# Patient Record
Sex: Male | Born: 1960 | Hispanic: Yes | Smoking: Never smoker
Health system: Southern US, Community
[De-identification: ages and names within clinical notes are randomized; demographics above are authoritative.]

## PROBLEM LIST (undated history)

## (undated) DIAGNOSIS — I10 Essential (primary) hypertension: Secondary | ICD-10-CM

## (undated) DIAGNOSIS — F32A Depression, unspecified: Secondary | ICD-10-CM

## (undated) DIAGNOSIS — F329 Major depressive disorder, single episode, unspecified: Secondary | ICD-10-CM

## (undated) DIAGNOSIS — F419 Anxiety disorder, unspecified: Secondary | ICD-10-CM

## (undated) DIAGNOSIS — E669 Obesity, unspecified: Secondary | ICD-10-CM

## (undated) NOTE — *Deleted (*Deleted)
NAMEDaeshawn Richard, MRN:  [id number], DOB:  August 11, 1960, LOS: 3 ADMISSION DATE:  04/09/2019, CONSULTATION DATE:  04/16/2019 REFERRING MD:  TRH - Ghimire, CHIEF COMPLAINT:  Acute hypoxemic respiratory failure   Brief History   56 year old alcoholic male with COVID 19 pneumonia induced acute hypoxemic respiratory failure and alcohol withdrawal who became obtunded and hypoxemic and was intubated emergently.  PCCM consulted for sedation and vent management.  PCCM will assume care.  History of present illness   88 year old alcoholic male with COVID 19 pneumonia induced acute hypoxemic respiratory failure and alcohol withdrawal who became obtunded and hypoxemic and was intubated emergently.  PCCM consulted for sedation and vent management.  PCCM will assume care.  Past Medical History  Etoh abuse  Significant Hospital Events   11/17 intubation for respiratory failure  Consults:  PCCM  Procedures:  ETT 11/17>>>  Significant Diagnostic Tests:  CXR that I reviewed myself with diffuse pulmonary infiltrate  Micro Data:  COVID 19 positive Blood cx 11/15>>>NTD  Antimicrobials:  Remdisivir 11/15>>>  Rocephin 11/15>>> Zithromax 11/15>>>  Interim history/subjective:  Intubated yesterday for AMS and hypoxemic respiratory failure  Objective   Blood pressure 119/68, pulse 64, temperature 100.3 F (37.9 C), temperature source Axillary, resp. rate (!) 30, height 5\' 8"  (1.727 m), weight 95.6 kg, SpO2 94 %.    Vent Mode: PRVC FiO2 (%):  [50 %-100 %] 50 % Set Rate:  [25 bmp-30 bmp] 30 bmp Vt Set:  [420 mL] 420 mL PEEP:  [8 ZDG38-75 cmH20] 8 cmH20 Plateau Pressure:  [24 cmH20-33 cmH20] 24 cmH20   Intake/Output Summary (Last 24 hours) at 04/17/2019 0858 Last data filed at 04/17/2019 0700 Gross per 24 hour  Intake 1223.2 ml  Output 1150 ml  Net 73.2 ml   Filed Weights   04/15/19 0550  Weight: 95.6 kg    Examination: General: Acutely ill appearing male, NAD, sedate HENT: [place]/AT,  PERRL, EOM-spontaneous and MMM Lungs: Diminished diffusely Cardiovascular: RRR, Nl S1/S2 and -M/R/G Abdomen: Soft, NT, ND and +BS Extremities: -edema and -tenderness Neuro: Sedate, withdraws ext to pain Skin: Intact  I reviewed CXR myself, ETT ok and infiltrate noted  Discussed with RT and RN  Resolved Hospital Problem list   N/A  Assessment & Plan:  50 year old male with COVID-19 and alcohol withdrawal now in acute hypoxemic respiratory failure with diffuse infiltrate.  VDRF: - ARDS protocol - Titrate O2 for sat of 85-88% - Goal today 50/8, hold off weaning given mental status - Adjust vent for ABG - Hold off diureses today - Decadron stop date in place - Remdesivir stop date in place  CAP: - Rocephin - Zithromax - F/u on culture  ETOH withdrawal: - Precedex - Thiamine - Folate - MVI - CIWA protocol - Versed drip started overnight, will continue for now  FEN: - OGT - Continue TF  Labs   CBC: Recent Labs  Lab 04/05/2019 1640 04/15/19 0900 04/16/19 0750 04/16/19 1504 04/17/19 0542 04/17/19 0645  WBC 8.9 10.0 11.5*  --  10.5  --   NEUTROABS 8.0* 8.9* 9.9*  --  8.3*  --   HGB 11.8* 11.6* 11.1* 13.3 10.7* 11.6*  HCT 40.9 40.6 40.3 39.0 37.5* 34.0*  MCV 71.8* 73.3* 73.9*  --  73.0*  --   PLT 308 375 478*  --  430*  --     Basic Metabolic Panel: Recent Labs  Lab 04/20/2019 1640 04/15/19 0900 04/16/19 0750 04/16/19 1504 04/17/19 0542 04/17/19  0645  NA 139 141 145 143 143 144  K 3.6 4.0 3.9 4.0 4.1 3.8  CL 101 105 103  --  101  --   CO2 26 26 33*  --  30  --   GLUCOSE 117* 137* 121*  --  120*  --   BUN 12 16 24*  --  60*  --   CREATININE 0.91 0.70 0.81  --  1.85*  --   CALCIUM 8.2* 8.4* 8.6*  --  8.5*  --   MG  --  2.2  2.1  --   --  2.4  --   PHOS  --  2.9  2.8  --   --  3.2  --    GFR: Estimated Creatinine Clearance: 55.4 mL/min (A) (by C-G formula based on SCr of 1.85 mg/dL (H)). Recent Labs  Lab 04/17/2019 1640 04/15/19 0900 04/16/19  0750 04/17/19 0542  PROCALCITON 1.50  --   --   --   WBC 8.9 10.0 11.5* 10.5  LATICACIDVEN 1.5  --   --   --     Liver Function Tests: Recent Labs  Lab 04/07/2019 1640 04/15/19 0900 04/16/19 0750 04/17/19 0542  AST 68* 45* 34 29  ALT 51* 40 34 26  ALKPHOS 100 94 94 82  BILITOT 0.5 0.4 0.8 0.4  PROT 7.5 7.4 7.4 6.9  ALBUMIN 3.2* 2.9* 3.1* 2.8*   No results for input(s): LIPASE, AMYLASE in the last 168 hours. No results for input(s): AMMONIA in the last 168 hours.  ABG    Component Value Date/Time   PHART 7.410 04/17/2019 0645   PCO2ART 54.8 (H) 04/17/2019 0645   PO2ART 74.0 (L) 04/17/2019 0645   HCO3 34.6 (H) 04/17/2019 0645   TCO2 36 (H) 04/17/2019 0645   O2SAT 94.0 04/17/2019 0645     Coagulation Profile: No results for input(s): INR, PROTIME in the last 168 hours.  Cardiac Enzymes: No results for input(s): CKTOTAL, CKMB, CKMBINDEX, TROPONINI in the last 168 hours.  HbA1C: No results found for: HGBA1C  CBG: Recent Labs  Lab 04/16/19 1937 04/17/19 0015 04/17/19 0515  GLUCAP 123* 151* 140*   The patient is critically ill with multiple organ systems failure and requires high complexity decision making for assessment and support, frequent evaluation and titration of therapies, application of advanced monitoring technologies and extensive interpretation of multiple databases.   Critical Care Time devoted to patient care services described in this note is  34  Minutes. This time reflects time of care of this signee Dr Koren Bound. This critical care time does not reflect procedure time, or teaching time or supervisory time of PA/NP/Med student/Med Resident etc but could involve care discussion time.  Alyson Reedy, M.D. [facility] Pulmonary/Critical Care Medicine.

## (undated) NOTE — *Deleted (*Deleted)
NAME:  Spencer Richard, MRN:  [id number], DOB:  01/25/1973, LOS: 8 ADMISSION DATE:  04/10/2019, CONSULTATION DATE:  11/17 REFERRING MD:  TRH, CHIEF COMPLAINT:  dyspnea    Brief History   45 y/o alcoholic male with COVID 19 pneumonia, intubated in setting of encephalopathy and respiratory failure on 11/17.     Past Medical History  HTN   Significant Hospital Events   11/15-Admit 11/17- Intubation for respiratory failure, severe bradycardia on precedex requiring atropine 11/21- Persistent fevers, Rapid afib, started amio 11/22- Converted to NSR   Consults:  PCCM   Procedures:  ETT 11/17 >  R IJ CVL 11/21>    Significant Diagnostic Tests:      Micro Data:  COVID 19 positive Blood cx 11/15>>>NTD   Antimicrobials:  Remdisivir 11/15>>> 11/19 Rocephin 11/15>>> 11/20 Zithromax 11/16>>> 11/20  Cefepime 11/21 >> Decadron 11/16 >>   Interim history/subjective:  Remains in sinus rhythm Heavily sedated   Objective   Blood pressure 111/70, pulse 77, temperature 99.9 F (37.7 C), resp. rate (!) 0, height 5\' 8"  (1.727 m), weight 82 kg, SpO2 99 %.     >  Vent Mode: PCV FiO2 (%):  [40 %] 40 % Set Rate:  [30 bmp] 30 bmp PEEP:  [5 cmH20] 5 cmH20 Plateau Pressure:  [16 cmH20-27 cmH20] 21 cmH20      Intake/Output Summary (Last 24 hours) at 2019-05-06 1038 Last data filed at 05/06/19 0830    Gross per 24 hour  Intake 4413.85 ml  Output 1815 ml  Net 2598.85 ml         Filed Weights    04/20/19 0500 04/21/19 0350 05/06/19 0500  Weight: 81.2 kg 82 kg 82 kg      Examination:   General:  In bed on vent HENT: NCAT ETT in place PULM: CTA B, vent supported breathing CV: RRR, no mgr GI: BS+, soft, nontender MSK: normal bulk and tone Neuro: sedated on vent   11/22 CXR persistent bilateral infiltrates, ETT in place cardiomgally   Resolved Hospital Problem list       Assessment & Plan:  ARDS: improving Pressure support ventilation as long as tolerated today  VAP prevention Wean off sedation   Rapid atrial fibrillation> resolved Stop metoprolol given mild hypotension Change amiodarone to oral   hypotention due to sedation Stop metoprolol Wean off neosynephrine for MAP > 65   Pneumonia: HCAP? Continue cefepime for 5 days   EtOH withdrawal: over sedated at this point Wean ativan scheduled dosing Wean off fentanyl infusion Consider change to precedex on 11/24   Elevated blood sugar SSI   FEN Tube feeding   Best practice:  Diet: tube feeding Pain/Anxiety/Delirium protocol (if indicated): as above VAP protocol (if indicated): yes DVT prophylaxis: lovenox GI prophylaxis: famotidine Glucose control: ssi Mobility: bed rest Code Status: full Family Communication: will update on 11/23 if we can contact them Disposition: remain in ICU   Labs   CBC: Last Labs               Recent Labs  Lab 04/16/19 0750   04/17/19 0542   04/18/19 0427 04/19/19 0337 04/20/19 0330 04/21/19 0502 05-06-19 0415 05/06/2019 0455  WBC 11.5*  --  10.5  --  11.5* 10.6* 19.3* 31.2*  --  21.2*  NEUTROABS 9.9*  --  8.3*  --  9.3* 8.6* 16.8*  --   --   --   HGB 11.1*   < > 10.7*   < >  10.9* 10.4* 12.3* 12.0* 11.2* 9.7*  HCT 40.3   < > 37.5*   < > 39.2 37.1* 44.5 42.8 33.0* 33.9*  MCV 73.9*  --  73.0*  --  74.1* 73.0* 73.7* 74.0*  --  72.9*  PLT 478*  --  430*  --  503* 531* 631* 716*  --  570*   < > = values in this interval not displayed.        Basic Metabolic Panel: Last Labs              Recent Labs  Lab 04/17/19 0542   04/18/19 0427 04/19/19 0337 04/20/19 0330 04/20/19 2120 04/21/19 0502 04/13/2019 0415 04/02/2019 0455  NA 143   < > 147* 149* 149* 148* 146* 148* 150*  K 4.1   < > 4.2 4.3 3.9 4.6 4.2 4.4 4.4  CL 101  --  104 108 107 107 108  --  114*  CO2 30  --  33* 31 29 30 28   --  26  GLUCOSE 120*  --  148* 153* 228* 167* 181*  --  142*  BUN 60*  --  45* 40* 50* 64* 66*  --  91*  CREATININE 1.85*  --  0.86 0.77 1.30* 1.51* 1.17  --   2.33*  CALCIUM 8.5*  --  8.6* 8.6* 8.6* 8.3* 8.3*  --  8.0*  MG 2.4  --  2.5* 2.3  --  3.0* 3.0*  --  2.9*  PHOS 3.2  --  3.5 3.2  --   --  4.2  --  4.3   < > = values in this interval not displayed.      GFR: Estimated Creatinine Clearance: 37.9 mL/min (A) (by C-G formula based on SCr of 2.33 mg/dL (H)). Last Labs         Recent Labs  Lab 04/19/19 0337 04/20/19 0330 04/21/19 0502 04/24/2019 0455  PROCALCITON  --  1.02 0.82  --   WBC 10.6* 19.3* 31.2* 21.2*        Liver Function Tests: Last Labs          Recent Labs  Lab 04/16/19 0750 04/17/19 0542 04/18/19 0427 04/19/19 0337 04/20/19 0330  AST 34 29 38 36 44*  ALT 34 26 27 28  34  ALKPHOS 94 82 91 90 107  BILITOT 0.8 0.4 0.4 0.7 0.8  PROT 7.4 6.9 7.2 6.7 7.5  ALBUMIN 3.1* 2.8* 2.7* 2.5* 2.6*      Last Labs   No results for input(s): LIPASE, AMYLASE in the last 168 hours.   Last Labs   No results for input(s): AMMONIA in the last 168 hours.     ABG Labs (Brief)          Component Value Date/Time    PHART 7.326 (L) 04/03/2019 0415    PCO2ART 52.1 (H) 04/13/2019 0415    PO2ART 82.0 (L) 04/01/2019 0415    HCO3 26.9 04/24/2019 0415    TCO2 28 04/01/2019 0415    O2SAT 94.0 04/17/2019 0415        Coagulation Profile: Last Labs   No results for input(s): INR, PROTIME in the last 168 hours.     Cardiac Enzymes: Last Labs   No results for input(s): CKTOTAL, CKMB, CKMBINDEX, TROPONINI in the last 168 hours.     HbA1C: Last Labs         Hgb A1c MFr Bld  Date/Time Value Ref Range Status  04/20/2019 03:30 AM 6.5 (H) 4.8 -  5.6 % Final      Comment:      (NOTE) Pre diabetes:          5.7%-6.4% Diabetes:              >6.4% Glycemic control for   <7.0% adults with diabetes          CBG: Last Labs          Recent Labs  Lab 04/21/19 1717 04/21/19 2024 05/18/19 0017 05/18/19 0338 2019-05-18 0858  GLUCAP 157* 136* 169* 149* 141*          Critical care time: 35 minutes        Roselie Awkward, MD [place] [facility] Pager: [phone] Cell: [phone] If no response, call [phone]                      Record: [id number] Message: Clinical Note

---
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
## 2019-03-31 DIAGNOSIS — E119 Type 2 diabetes mellitus without complications: Secondary | ICD-10-CM

## 2019-03-31 HISTORY — DX: Type 2 diabetes mellitus without complications: E11.9

## 2019-04-14 ENCOUNTER — Other Ambulatory Visit: Payer: Self-pay

## 2019-04-14 ENCOUNTER — Inpatient Hospital Stay (HOSPITAL_COMMUNITY)
Admission: EM | Admit: 2019-04-14 | Discharge: 2019-04-30 | DRG: 207 | Disposition: E | Payer: HRSA Program | Attending: Internal Medicine | Admitting: Internal Medicine

## 2019-04-14 ENCOUNTER — Emergency Department (HOSPITAL_COMMUNITY): Payer: HRSA Program

## 2019-04-14 ENCOUNTER — Ambulatory Visit (HOSPITAL_COMMUNITY)
Admission: EM | Admit: 2019-04-14 | Discharge: 2019-04-14 | Disposition: A | Discharge status: Nursing Home (Includes ICF) | Payer: Self-pay | Attending: Emergency Medicine | Admitting: Emergency Medicine

## 2019-04-14 DIAGNOSIS — R6521 Severe sepsis with septic shock: Secondary | ICD-10-CM | POA: Diagnosis not present

## 2019-04-14 DIAGNOSIS — D649 Anemia, unspecified: Secondary | ICD-10-CM | POA: Diagnosis not present

## 2019-04-14 DIAGNOSIS — Y92239 Unspecified place in hospital as the place of occurrence of the external cause: Secondary | ICD-10-CM | POA: Diagnosis not present

## 2019-04-14 DIAGNOSIS — N493 Fournier gangrene: Secondary | ICD-10-CM | POA: Diagnosis not present

## 2019-04-14 DIAGNOSIS — T4275XA Adverse effect of unspecified antiepileptic and sedative-hypnotic drugs, initial encounter: Secondary | ICD-10-CM | POA: Diagnosis not present

## 2019-04-14 DIAGNOSIS — Z978 Presence of other specified devices: Secondary | ICD-10-CM

## 2019-04-14 DIAGNOSIS — Z1611 Resistance to penicillins: Secondary | ICD-10-CM | POA: Diagnosis present

## 2019-04-14 DIAGNOSIS — J80 Acute respiratory distress syndrome: Secondary | ICD-10-CM | POA: Diagnosis present

## 2019-04-14 DIAGNOSIS — E870 Hyperosmolality and hypernatremia: Secondary | ICD-10-CM | POA: Diagnosis not present

## 2019-04-14 DIAGNOSIS — J1289 Other viral pneumonia: Secondary | ICD-10-CM | POA: Diagnosis present

## 2019-04-14 DIAGNOSIS — F329 Major depressive disorder, single episode, unspecified: Secondary | ICD-10-CM | POA: Diagnosis present

## 2019-04-14 DIAGNOSIS — J9601 Acute respiratory failure with hypoxia: Secondary | ICD-10-CM

## 2019-04-14 DIAGNOSIS — A4189 Other specified sepsis: Secondary | ICD-10-CM | POA: Diagnosis not present

## 2019-04-14 DIAGNOSIS — Z6839 Body mass index (BMI) 39.0-39.9, adult: Secondary | ICD-10-CM

## 2019-04-14 DIAGNOSIS — N170 Acute kidney failure with tubular necrosis: Secondary | ICD-10-CM | POA: Diagnosis not present

## 2019-04-14 DIAGNOSIS — E8770 Fluid overload, unspecified: Secondary | ICD-10-CM | POA: Diagnosis not present

## 2019-04-14 DIAGNOSIS — F10231 Alcohol dependence with withdrawal delirium: Secondary | ICD-10-CM | POA: Diagnosis not present

## 2019-04-14 DIAGNOSIS — E1165 Type 2 diabetes mellitus with hyperglycemia: Secondary | ICD-10-CM | POA: Diagnosis not present

## 2019-04-14 DIAGNOSIS — I480 Paroxysmal atrial fibrillation: Secondary | ICD-10-CM | POA: Diagnosis not present

## 2019-04-14 DIAGNOSIS — R001 Bradycardia, unspecified: Secondary | ICD-10-CM | POA: Diagnosis not present

## 2019-04-14 DIAGNOSIS — G312 Degeneration of nervous system due to alcohol: Secondary | ICD-10-CM | POA: Diagnosis not present

## 2019-04-14 DIAGNOSIS — U071 COVID-19: Principal | ICD-10-CM | POA: Diagnosis present

## 2019-04-14 DIAGNOSIS — Z79899 Other long term (current) drug therapy: Secondary | ICD-10-CM | POA: Diagnosis not present

## 2019-04-14 DIAGNOSIS — B961 Klebsiella pneumoniae [K. pneumoniae] as the cause of diseases classified elsewhere: Secondary | ICD-10-CM | POA: Diagnosis present

## 2019-04-14 DIAGNOSIS — Z515 Encounter for palliative care: Secondary | ICD-10-CM | POA: Diagnosis not present

## 2019-04-14 DIAGNOSIS — Z452 Encounter for adjustment and management of vascular access device: Secondary | ICD-10-CM

## 2019-04-14 DIAGNOSIS — Z66 Do not resuscitate: Secondary | ICD-10-CM | POA: Diagnosis not present

## 2019-04-14 DIAGNOSIS — J9600 Acute respiratory failure, unspecified whether with hypoxia or hypercapnia: Secondary | ICD-10-CM

## 2019-04-14 DIAGNOSIS — J189 Pneumonia, unspecified organism: Secondary | ICD-10-CM

## 2019-04-14 DIAGNOSIS — Z9289 Personal history of other medical treatment: Secondary | ICD-10-CM

## 2019-04-14 DIAGNOSIS — R0902 Hypoxemia: Secondary | ICD-10-CM

## 2019-04-14 DIAGNOSIS — I10 Essential (primary) hypertension: Secondary | ICD-10-CM | POA: Diagnosis present

## 2019-04-14 DIAGNOSIS — R0602 Shortness of breath: Secondary | ICD-10-CM

## 2019-04-14 DIAGNOSIS — Z20822 Contact with and (suspected) exposure to covid-19: Secondary | ICD-10-CM

## 2019-04-14 LAB — CBC WITH DIFFERENTIAL/PLATELET
Abs Immature Granulocytes: 0.04 10*3/uL (ref 0.00–0.07)
Basophils Absolute: 0 10*3/uL (ref 0.0–0.1)
Basophils Relative: 0 %
Eosinophils Absolute: 0 10*3/uL (ref 0.0–0.5)
Eosinophils Relative: 0 %
HCT: 40.9 % (ref 39.0–52.0)
Hemoglobin: 11.8 g/dL — ABNORMAL LOW (ref 13.0–17.0)
Immature Granulocytes: 0 %
Lymphocytes Relative: 8 %
Lymphs Abs: 0.7 10*3/uL (ref 0.7–4.0)
MCH: 20.7 pg — ABNORMAL LOW (ref 26.0–34.0)
MCHC: 28.9 g/dL — ABNORMAL LOW (ref 30.0–36.0)
MCV: 71.8 fL — ABNORMAL LOW (ref 80.0–100.0)
Monocytes Absolute: 0.2 10*3/uL (ref 0.1–1.0)
Monocytes Relative: 2 %
Neutro Abs: 8 10*3/uL — ABNORMAL HIGH (ref 1.7–7.7)
Neutrophils Relative %: 90 %
Platelets: 308 10*3/uL (ref 150–400)
RBC: 5.7 MIL/uL (ref 4.22–5.81)
RDW: 19.7 % — ABNORMAL HIGH (ref 11.5–15.5)
WBC: 8.9 10*3/uL (ref 4.0–10.5)
nRBC: 0 % (ref 0.0–0.2)

## 2019-04-14 LAB — COMPREHENSIVE METABOLIC PANEL
ALT: 51 U/L — ABNORMAL HIGH (ref 0–44)
AST: 68 U/L — ABNORMAL HIGH (ref 15–41)
Albumin: 3.2 g/dL — ABNORMAL LOW (ref 3.5–5.0)
Alkaline Phosphatase: 100 U/L (ref 38–126)
Anion gap: 12 (ref 5–15)
BUN: 12 mg/dL (ref 6–20)
CO2: 26 mmol/L (ref 22–32)
Calcium: 8.2 mg/dL — ABNORMAL LOW (ref 8.9–10.3)
Chloride: 101 mmol/L (ref 98–111)
Creatinine, Ser: 0.91 mg/dL (ref 0.61–1.24)
GFR calc Af Amer: >60 mL/min (ref >60)
GFR calc non Af Amer: >60 mL/min (ref >60)
Glucose, Bld: 117 mg/dL — ABNORMAL HIGH (ref 70–99)
Potassium: 3.6 mmol/L (ref 3.5–5.1)
Sodium: 139 mmol/L (ref 135–145)
Total Bilirubin: 0.5 mg/dL (ref 0.3–1.2)
Total Protein: 7.5 g/dL (ref 6.5–8.1)

## 2019-04-14 LAB — LACTIC ACID, PLASMA: Lactic Acid, Venous: 1.5 mmol/L (ref 0.5–1.9)

## 2019-04-14 LAB — PROCALCITONIN: Procalcitonin: 1.5 ng/mL

## 2019-04-14 LAB — LACTATE DEHYDROGENASE: LDH: 358 U/L — ABNORMAL HIGH (ref 98–192)

## 2019-04-14 LAB — FERRITIN: Ferritin: 62 ng/mL (ref 24–336)

## 2019-04-14 LAB — TRIGLYCERIDES: Triglycerides: 79 mg/dL (ref <150)

## 2019-04-14 LAB — D-DIMER, QUANTITATIVE: D-Dimer, Quant: 0.99 ug/mL-FEU — ABNORMAL HIGH (ref 0.00–0.50)

## 2019-04-14 LAB — FIBRINOGEN: Fibrinogen: 697 mg/dL — ABNORMAL HIGH (ref 210–475)

## 2019-04-14 LAB — SARS CORONAVIRUS 2 (TAT 6-24 HRS): SARS Coronavirus 2: POSITIVE — AB

## 2019-04-14 LAB — C-REACTIVE PROTEIN: CRP: 25.5 mg/dL — ABNORMAL HIGH (ref <1.0)

## 2019-04-14 MED ORDER — SODIUM CHLORIDE 0.9 % IV SOLN
200.0000 mg | Freq: Once | INTRAVENOUS | Status: AC
  Administered 2019-04-15: 200 mg via INTRAVENOUS
  Filled 2019-04-14: qty 40

## 2019-04-14 MED ORDER — SODIUM CHLORIDE 0.9 % IV SOLN: 100.0000 mg | Freq: Every day | INTRAVENOUS | Status: DC

## 2019-04-14 MED ORDER — ACETAMINOPHEN 500 MG PO TABS
1000.0000 mg | ORAL_TABLET | Freq: Once | ORAL | Status: AC
  Administered 2019-04-14: 17:00:00 1000 mg via ORAL
  Filled 2019-04-14: qty 2

## 2019-04-14 MED ORDER — DEXAMETHASONE SODIUM PHOSPHATE 10 MG/ML IJ SOLN
6.0000 mg | Freq: Once | INTRAMUSCULAR | Status: AC
  Administered 2019-04-14: 6 mg via INTRAVENOUS
  Filled 2019-04-14: qty 1

## 2019-04-14 NOTE — ED Triage Notes (Signed)
Ems called for patient

## 2019-04-14 NOTE — ED Triage Notes (Signed)
3:48pm- I called pt. Back & sat him in a wheelchair, he immediately started painting and wheezing for air, called for help.  3:50pm- Immediately took his vitals  BP-219/98 O2- 78 3:52pm- Grabbed a full oxygen tank  I left the room

## 2019-04-14 NOTE — ED Provider Notes (Addendum)
HPI  SUBJECTIVE:  Spencer Richard is a 58 y.o. male who presents with 3 to 4 days of dry cough, chest congestion, shortness of breath, dyspnea on exertion.  Unable to walk more than a few steps without becoming short of breath.  He reports anorexia.  No fevers at home, no chest pain, body aches.  Reports a "light" headache.  No loss of sense of smell or taste.  No nasal congestion, sore throat, vomiting.  Reports lower abdominal pain, some diarrhea.  No sick contacts or exposure to Covid.  He has been taking Tylenol and aspirin without improvement in his symptoms.  Symptoms are worse with lying down.  He also reports lower extremity edema, abdominal distention, nocturia.  Unsure if he has gained any weight.  No orthopnea, PND.  Past medical history of hypertension, GI bleed.  No history of asthma, emphysema, COPD, smoking.  He is not on any supplemental oxygen at home.  No history of diabetes, CHF, cirrhosis, PE, DVT.  History limited due to respiratory distress.   Past Medical History:  Diagnosis Date   Hypertension     No past surgical history on file.  No family history on file.  Social History   Tobacco Use   Smoking status: Never Smoker   Smokeless tobacco: Never Used  Substance Use Topics   Alcohol use: Yes    Comment: daily   Drug use: No    No current facility-administered medications for this encounter.   Current Outpatient Medications:    chlordiazePOXIDE (LIBRIUM) 10 MG capsule, twice a day today one tomorrow then d/c, Disp: 3 capsule, Rfl: 0   lurasidone (LATUDA) 80 MG TABS tablet, Take 80 mg by mouth daily with breakfast., Disp: , Rfl:    metoprolol succinate (TOPROL-XL) 100 MG 24 hr tablet, Take 1 tablet (100 mg total) by mouth daily. Take with or immediately following a meal., Disp: 90 tablet, Rfl: 1   mirtazapine (REMERON) 15 MG tablet, Take 1 tablet (15 mg total) by mouth at bedtime., Disp: 30 tablet, Rfl: 2   PARoxetine (PAXIL) 20 MG tablet, Take 1 tablet (20 mg  total) by mouth daily., Disp: 30 tablet, Rfl: 2  No Known Allergies   ROS  As noted in HPI.   Physical Exam  BP (!) 187/98   Temp (!) 103 F (39.4 C) (Other (Comment)) O2 sat 75-78%.  Heart rate 108 Constitutional: Well developed, well nourished, acute respiratory distress.  Coughing.  Only able to speak in short sentences Eyes: PERRL, EOMI, conjunctiva normal bilaterally HENT: Normocephalic, atraumatic,mucus membranes moist Respiratory: Poor inspiratory effort, clear to auscultation bilaterally, no rales, no wheezing, no rhonchi Cardiovascular: Tachycardic, no murmurs, no gallops, no rubs GI: Soft, nondistended, normal bowel sounds, nontender, no rebound, no guarding.  No anasarca skin: No rash, skin intact Musculoskeletal: 1Plus edema bilaterally lower extremities, no tenderness, no deformities Neurologic: Alert & oriented x 3, CN III-XII grossly intact, no motor deficits, sensation grossly intact Psychiatric: Speech and behavior appropriate   ED Course   Medications - No data to display  No orders of the defined types were placed in this encounter.  No results found for this or any previous visit (from the past 24 hour(s)). No results found.  ED Clinical Impression  1. Hypoxia   2. SOB (shortness of breath)      ED Assessment/Plan   Concern for Covid.  May have a component of CHF as well.  History was limited due to respiratory distress.  Patient is hypertensive,  febrile, hypoxic to 75-78% on room air improved to 94% on 4 L of supplemental oxygen. transferring to the ED via EMS for further care.   No orders of the defined types were placed in this encounter.   *This clinic note was created using Dragon dictation software. Therefore, there may be occasional mistakes despite careful proofreading.  ?    Melynda Ripple, MD 04/05/2019 1613    Melynda Ripple, MD 12/13/19 [phone]

## 2019-04-14 NOTE — ED Notes (Signed)
Respiratory at bedside switching pt to Hi-Flo n/c.

## 2019-04-14 NOTE — Progress Notes (Signed)
Pharmacy Antibiotic Note  Spencer Richard is a 58 y.o. male admitted on 04/29/2019 with COVID 19.  Pharmacy has been consulted for Remdesivir dosing.  ALT 51 CXR shows extensive bilateral interstitial and heterogeneous pulmonary opacity, consistent with multifocal infection. Pt on 30L HFNC  Plan: Remdesivir 200mg  IV now then 100mg  daily x 4 days Will f/u ALT     Temp (24hrs), Avg:102 F (38.9 C), Min:100.1 F (37.8 C), Max:103 F (39.4 C)  Recent Labs  Lab 04/03/2019 1640  WBC 8.9  CREATININE 0.91  LATICACIDVEN 1.5    CrCl cannot be calculated (Unknown ideal weight.).    No Known Allergies  Antimicrobials this admission: 11/15 Remdesivir >> 11/19  Microbiology results: 11/15 BCx:  11/15 SARS coronavirus 2: positive  Thank you for allowing pharmacy to be a part of this patient's care.  Sherlon Handing, PharmD, BCPS [facility] Clinical pharmacist phone [phone] 04/26/2019 11:08 PM

## 2019-04-14 NOTE — ED Triage Notes (Signed)
Pt had been feeling "bad" with generalized weakness, SOB & fever the past 4 days. This morning his SOB was worse so he went to the urgent care, they sent him here d/t de-saturations of 72% on RA & a fever. He was given 4L via n/c & his sats increased to 97% but with minimal exertion he de-sated again when he stood to get him on the EMS stretcher. EMS gave him 10L O2 via NRB while in route to ED & he went up to 98% again, pt is A/Ox4 upon arrival.

## 2019-04-14 NOTE — ED Provider Notes (Signed)
[facility] Emergency Department Provider Note MRN:  [id number]  Arrival date & time: 04/12/2019     Chief Complaint   fever/ low sats   History of Present Illness   Spencer Richard is a 58 y.o. year-old male with a history of hypertension presenting to the ED with chief complaint of fever.  5 days of fever, dry cough, general malaise.  Was seen at urgent care today, found to be hypoxic in the 70s.  Fever to 103.  Placed on nonrebreather by EMS with improvement in oxygenation.  Denies chest pain, no leg pain or swelling, no abdominal pain.  No known exposure to COVID-19.  Review of Systems  A complete 10 system review of systems was obtained and all systems are negative except as noted in the HPI and PMH.   Patient's Health History    Past Medical History:  Diagnosis Date  . Hypertension     History reviewed. No pertinent surgical history.  History reviewed. No pertinent family history.  Social History   Socioeconomic History  . Marital status: Married    Spouse name: Not on file  . Number of children: Not on file  . Years of education: Not on file  . Highest education level: Not on file  Occupational History  . Not on file  Social Needs  . Financial resource strain: Not on file  . Food insecurity    Worry: Not on file    Inability: Not on file  . Transportation needs    Medical: Not on file    Non-medical: Not on file  Tobacco Use  . Smoking status: Never Smoker  . Smokeless tobacco: Never Used  Substance and Sexual Activity  . Alcohol use: Yes    Comment: daily  . Drug use: No  . Sexual activity: Yes  Lifestyle  . Physical activity    Days per week: Not on file    Minutes per session: Not on file  . Stress: Not on file  Relationships  . Social Musicianconnections    Talks on phone: Not on file    Gets together: Not on file    Attends religious service: Not on file    Active member of club or organization: Not on file    Attends meetings of  clubs or organizations: Not on file    Relationship status: Not on file  . Intimate partner violence    Fear of current or ex partner: Not on file    Emotionally abused: Not on file    Physically abused: Not on file    Forced sexual activity: Not on file  Other Topics Concern  . Not on file  Social History Narrative  . Not on file     Physical Exam  Vital Signs and Nursing Notes reviewed Vitals:   04/15/2019 1654 04/24/2019 1655  BP:    Pulse: (!) 105 (!) 104  Resp: (!) 29 (!) 35  Temp:    SpO2: 97% 97%    CONSTITUTIONAL: Well-appearing, NAD NEURO:  Alert and oriented x 3, no focal deficits EYES:  eyes equal and reactive ENT/NECK:  no LAD, no JVD CARDIO: Regular rate, well-perfused, normal S1 and S2 PULM: Decreased breath sounds bilateral bases, mildly tachypneic GI/GU:  normal bowel sounds, non-distended, non-tender MSK/SPINE:  No gross deformities, no edema SKIN:  no rash, atraumatic PSYCH:  Appropriate speech and behavior  Diagnostic and Interventional Summary    EKG Interpretation  Date/Time:  Sunday April 14 2019 16:29:54 EST  Ventricular Rate:  103 PR Interval:    QRS Duration: 88 QT Interval:  337 QTC Calculation: 442 R Axis:   -27 Text Interpretation: Sinus tachycardia Borderline left axis deviation Confirmed by Gerlene Fee [phone]) on 04/18/2019 4:36:44 PM      Labs Reviewed  CBC WITH DIFFERENTIAL/PLATELET - Abnormal; Notable for the following components:      Result Value   Hemoglobin 11.8 (*)    MCV 71.8 (*)    MCH 20.7 (*)    MCHC 28.9 (*)    RDW 19.7 (*)    Neutro Abs 8.0 (*)    All other components within normal limits  COMPREHENSIVE METABOLIC PANEL - Abnormal; Notable for the following components:   Glucose, Bld 117 (*)    Calcium 8.2 (*)    Albumin 3.2 (*)    AST 68 (*)    ALT 51 (*)    All other components within normal limits  D-DIMER, QUANTITATIVE (NOT AT [facility]) - Abnormal; Notable for the following components:   D-Dimer, Quant 0.99  (*)    All other components within normal limits  LACTATE DEHYDROGENASE - Abnormal; Notable for the following components:   LDH 358 (*)    All other components within normal limits  FIBRINOGEN - Abnormal; Notable for the following components:   Fibrinogen 697 (*)    All other components within normal limits  C-REACTIVE PROTEIN - Abnormal; Notable for the following components:   CRP 25.5 (*)    All other components within normal limits  CULTURE, BLOOD (ROUTINE X 2)  CULTURE, BLOOD (ROUTINE X 2)  SARS CORONAVIRUS 2 (TAT 6-24 HRS)  LACTIC ACID, PLASMA  FERRITIN  TRIGLYCERIDES  LACTIC ACID, PLASMA  PROCALCITONIN    DG Chest Port 1 View  Final Result      Medications  dexamethasone (DECADRON) injection 6 mg (6 mg Intravenous Given 04/13/2019 1654)  acetaminophen (TYLENOL) tablet 1,000 mg (1,000 mg Oral Given 04/23/2019 1707)     Procedures  /  Critical Care .Critical Care Performed by: Maudie Flakes, MD Authorized by: Maudie Flakes, MD   Critical care provider statement:    Critical care time (minutes):  45   Critical care was necessary to treat or prevent imminent or life-threatening deterioration of the following conditions: Hypoxic respiratory failure, concern for COVID-19.   Critical care was time spent personally by me on the following activities:  Discussions with consultants, evaluation of patient's response to treatment, examination of patient, ordering and performing treatments and interventions, ordering and review of laboratory studies, ordering and review of radiographic studies, pulse oximetry, re-evaluation of patient's condition, obtaining history from patient or surrogate and review of old charts    ED Course and Medical Decision Making  I have reviewed the triage vital signs and the nursing notes.  Pertinent labs & imaging results that were available during my care of the patient were reviewed by me and considered in my medical decision making (see below for  details).     Concern for COVID-19, no chest pain, no evidence of DVT, low concern for PE especially given the prodrome of cold-like symptoms.  Patient seems relatively comfortable on nonrebreather, will transition to high flow nasal cannula with the help of respiratory therapy.  Anticipating admission.  Chest x-ray highly suspicious for multifocal viral pneumonia, admitted to hospitalist service for further care.  Patient seems relatively comfortable on high flow nasal cannula.  Barth Kirks. Sedonia Small, [place] [email or web address]  Final Clinical Impressions(s) / ED Diagnoses     ICD-10-CM   1. Multifocal pneumonia  J18.9   2. Acute respiratory failure with hypoxia (HCC)  J96.01   3. Suspected COVID-19 virus infection  Z20.828     ED Discharge Orders    None       Discharge Instructions Discussed with and Provided to Patient:   Discharge Instructions   None       Sabas Sous, MD 05-08-19 1815

## 2019-04-14 NOTE — ED Notes (Addendum)
Roselyn Reef, ED Charge RN notified of pt & potentially Covid pt.  EMS report given per Dr Alphonzo Cruise.

## 2019-04-14 NOTE — ED Triage Notes (Signed)
Pt c/o SOB.   SaO2 = 72% RA.  Placed on 4L O2 via [place] with SaO2 increased to 93%. [facility] called for pt transport.  Eval per Dr Alphonzo Cruise in progress.

## 2019-04-14 NOTE — ED Triage Notes (Signed)
Dr. Alphonzo Cruise at bedside with patient.

## 2019-04-14 NOTE — H&P (Signed)
History and Physical   Spencer Richard JJH:[id number] DOB: 01/25/1973 DOA: 04/03/2019  Referring MD/NP/PA: Dr. Sedonia Small  PCP: Pt, No Pcp Per (Inactive)   Outpatient Specialists: None  Patient coming from: Home  Chief Complaint: Shortness of breath and cough  HPI: Spencer Richard is a 58 y.o. male with medical history significant of hypertension, morbid obesity, depression with anxiety who presented with fever, shortness of breath and cough for the last 5 days.  Associated with diaphoresis.  Patient was brought to the ER on nonrebreather bag by EMS.  He was found to have a temperature 103 on arrival and was very tachypneic.  His evaluation showed bilateral infiltrates consistent with Covid pneumonia and subsequent testing shows COVID-19 positive.  Patient has improvement currently titrated to oxygen by nasal cannula.  He denied any knowledge of close contacts with Covid.  He denied any dizziness.  No nausea vomiting or diarrhea.  His oxygen sat originally was in the 70s.  That was earlier in the day when he went to urgent care center.  He looks comfortable now but due to Covid pneumonia with oxygen demand patient is being admitted to the hospital for treatment.  ED Course: Temperature 103 blood pressure 193/111 pulse 107 respiratory rate of 40 oxygen sats 92% on 2 L.  CBC and chemistry appear to be within normal.  D-dimer is 0.99 fibrinogen 697.  Chest x-ray showed bilateral infiltrates consistent with multifocal infection.  Patient has been initiated on antibiotics breathing treatment oxygen and is being admitted to the hospital.  Review of Systems: As per HPI otherwise 10 point review of systems negative.    Past Medical History:  Diagnosis Date  . Hypertension     History reviewed. No pertinent surgical history.   reports that he has never smoked. He has never used smokeless tobacco. He reports current alcohol use. He reports that he does not use drugs.  No Known Allergies  History  reviewed. No pertinent family history.   Prior to Admission medications   Medication Sig Start Date End Date Taking? Authorizing Provider  chlordiazePOXIDE (LIBRIUM) 10 MG capsule twice a day today one tomorrow then d/c Patient taking differently: Take 10 mg by mouth daily as needed for withdrawal.  11/16/18  Yes Johnn Hai, MD  lurasidone (LATUDA) 80 MG TABS tablet Take 80 mg by mouth daily with breakfast.   Yes [provider]  metoprolol succinate (TOPROL-XL) 100 MG 24 hr tablet Take 1 tablet (100 mg total) by mouth daily. Take with or immediately following a meal. 11/14/18  Yes Johnn Hai, MD  mirtazapine (REMERON) 15 MG tablet Take 1 tablet (15 mg total) by mouth at bedtime. 11/14/18 11/14/19 Yes Johnn Hai, MD  PARoxetine (PAXIL) 20 MG tablet Take 1 tablet (20 mg total) by mouth daily. 11/14/18  Yes Johnn Hai, MD    Physical Exam: Vitals:   04/19/2019 1845 04/17/2019 1900 04/10/2019 1913 04/15/2019 1941  BP: (!) 160/88 (!) 159/90  (!) 163/98  Pulse: 100 92  88  Resp: (!) 25 (!) 29  (!) 33  Temp:   100.1 F (37.8 C)   TempSrc:   Oral   SpO2: 97% 97%  96%      Constitutional: Morbidly obese, tachypneic, no acute distress Vitals:   04/09/2019 1845 04/27/2019 1900 04/27/2019 1913 04/13/2019 1941  BP: (!) 160/88 (!) 159/90  (!) 163/98  Pulse: 100 92  88  Resp: (!) 25 (!) 29  (!) 33  Temp:   100.1 F (37.8 C)  TempSrc:   Oral   SpO2: 97% 97%  96%   Eyes: PERRL, lids and conjunctivae normal ENMT: Mucous membranes are moist. Posterior pharynx clear of any exudate or lesions.Normal dentition.  Neck: normal, supple, no masses, no thyromegaly Respiratory: Decreased air entry bilaterally with coarse breath sounds, rhonchi, mild expiratory wheezing, use of accessory muscle.  Cardiovascular: Sinus tachycardia no murmurs / rubs / gallops. No extremity edema. 2+ pedal pulses. No carotid bruits.  Abdomen: no tenderness, no masses palpated. No hepatosplenomegaly. Bowel sounds positive.   Musculoskeletal: no clubbing / cyanosis. No joint deformity upper and lower extremities. Good ROM, no contractures. Normal muscle tone.  Skin: no rashes, lesions, ulcers. No induration Neurologic: CN 2-12 grossly intact. Sensation intact, DTR normal. Strength 5/5 in all 4.  Psychiatric: Normal judgment and insight. Alert and oriented x 3. Normal mood.     Labs on Admission: I have personally reviewed following labs and imaging studies  CBC: Recent Labs  Lab 28-Jun-2018 1640  WBC 8.9  NEUTROABS 8.0*  HGB 11.8*  HCT 40.9  MCV 71.8*  PLT 308   Basic Metabolic Panel: Recent Labs  Lab 28-Jun-2018 1640  NA 139  K 3.6  CL 101  CO2 26  GLUCOSE 117*  BUN 12  CREATININE 0.91  CALCIUM 8.2*   GFR: CrCl cannot be calculated (Unknown ideal weight.). Liver Function Tests: Recent Labs  Lab 28-Jun-2018 1640  AST 68*  ALT 51*  ALKPHOS 100  BILITOT 0.5  PROT 7.5  ALBUMIN 3.2*   No results for input(s): LIPASE, AMYLASE in the last 168 hours. No results for input(s): AMMONIA in the last 168 hours. Coagulation Profile: No results for input(s): INR, PROTIME in the last 168 hours. Cardiac Enzymes: No results for input(s): CKTOTAL, CKMB, CKMBINDEX, TROPONINI in the last 168 hours. BNP (last 3 results) No results for input(s): PROBNP in the last 8760 hours. HbA1C: No results for input(s): HGBA1C in the last 72 hours. CBG: No results for input(s): GLUCAP in the last 168 hours. Lipid Profile: Recent Labs    28-Jun-2018 1640  TRIG 79   Thyroid Function Tests: No results for input(s): TSH, T4TOTAL, FREET4, T3FREE, THYROIDAB in the last 72 hours. Anemia Panel: Recent Labs    28-Jun-2018 1640  FERRITIN 62   Urine analysis:    Component Value Date/Time   COLORURINE YELLOW 11/11/2018 2232   APPEARANCEUR HAZY (A) 11/11/2018 2232   LABSPEC 1.019 11/11/2018 2232   PHURINE 5.0 11/11/2018 2232   GLUCOSEU NEGATIVE 11/11/2018 2232   HGBUR MODERATE (A) 11/11/2018 2232   BILIRUBINUR NEGATIVE  11/11/2018 2232   KETONESUR NEGATIVE 11/11/2018 2232   PROTEINUR 100 (A) 11/11/2018 2232   NITRITE NEGATIVE 11/11/2018 2232   LEUKOCYTESUR NEGATIVE 11/11/2018 2232   Sepsis Labs: @LABRCNTIP (procalcitonin:4,lacticidven:4) ) Recent Results (from the past 240 hour(s))  SARS CORONAVIRUS 2 (TAT 6-24 HRS) Nasopharyngeal Nasopharyngeal Swab     Status: Abnormal   Collection Time: 28-Jun-2018  4:36 PM   Specimen: Nasopharyngeal Swab  Result Value Ref Range Status   SARS Coronavirus 2 POSITIVE (A) NEGATIVE Final    Comment: RESULT CALLED TO, READ BACK BY AND VERIFIED WITH: Pattricia BossKENNEDY R, RN AT 2248 ON 30-Jan-202020 BY SAINVILUS S (NOTE) SARS-CoV-2 target nucleic acids are DETECTED. The SARS-CoV-2 RNA is generally detectable in upper and lower respiratory specimens during the acute phase of infection. Positive results are indicative of active infection with SARS-CoV-2. Clinical  correlation with patient history and other diagnostic information is necessary to determine patient infection status.  Positive results do  not rule out bacterial infection or co-infection with other viruses. The expected result is Negative. Fact Sheet for Patients: HairSlick.no Fact Sheet for Healthcare Providers: quierodirigir.com This test is not yet approved or cleared by the [place] FDA and  has been authorized for detection and/or diagnosis of SARS-CoV-2 by FDA under an Emergency Use Authorization (EUA). This EUA will remain  in effect (meaning this test can  be used) for the duration of the COVID-19 declaration under Section 564(b)(1) of the Act, 21 U.S.C. section 360bbb-3(b)(1), unless the authorization is terminated or revoked sooner. Performed at [facility], [address], [place], [place] [address]      Radiological Exams on Admission: Dg Chest Port 1 View  Result Date: 04/15/2019 CLINICAL DATA:  Shortness of breath, fever, weakness EXAM:  PORTABLE CHEST 1 VIEW COMPARISON:  11/11/2018 FINDINGS: Cardiomegaly. Extensive bilateral interstitial and heterogeneous pulmonary opacity. The visualized skeletal structures are unremarkable. IMPRESSION: 1. Extensive bilateral interstitial and heterogeneous pulmonary opacity, consistent with multifocal infection. 2.  Cardiomegaly. Electronically Signed   By: Lauralyn Primes M.D.   On: 04/21/2019 17:54    EKG: Independently reviewed.  It shows sinus tachycardia with a rate of 103.  Evidence of LVH by voltage criteria.  Assessment/Plan Principal Problem:   Pneumonia due to COVID-19 virus Active Problems:   Major depressive disorder, single episode, severe without psychosis (HCC)   HTN (hypertension)   Tachypnea     #1 COVID-19 pneumonia: Patient will be admitted to [facility].  Initiate remdesivir with dexamethasone as well as Rocephin and Zithromax.  Also Lovenox and oxygen.  Albuterol and Atrovent inhalers.  Follow protocol by primary team.  #2 hypertension: Resume home regimen and monitor  #3 history of depression: Continue home regimen.  #4 acute hypoxemia: Secondary to #1 above.  Maintain on oxygen and titrate off  #5 morbid obesity: Dietary counseling   DVT prophylaxis: Lovenox Code Status: Full code Family Communication: No family at bedside Disposition Plan: Home Consults called: None Admission status: Inpatient  Severity of Illness: The appropriate patient status for this patient is INPATIENT. Inpatient status is judged to be reasonable and necessary in order to provide the required intensity of service to ensure the patient's safety. The patient's presenting symptoms, physical exam findings, and initial radiographic and laboratory data in the context of their chronic comorbidities is felt to place them at high risk for further clinical deterioration. Furthermore, it is not anticipated that the patient will be medically stable for discharge from the hospital within 2  midnights of admission. The following factors support the patient status of inpatient.   " The patient's presenting symptoms include shortness of breath and cough. " The worrisome physical exam findings include bilateral basal crackles. " The initial radiographic and laboratory data are worrisome because of bilateral pneumonia. " The chronic co-morbidities include hypertension.   * I certify that at the point of admission it is my clinical judgment that the patient will require inpatient hospital care spanning beyond 2 midnights from the point of admission due to high intensity of service, high risk for further deterioration and high frequency of surveillance required.Lonia Blood MD [facility] Pager [phone]  If 7PM-7AM, please contact night-coverage [email or web address] Password [email or web address]  04/06/2019, 11:07 PM

## 2019-04-15 LAB — CBC WITH DIFFERENTIAL/PLATELET
Abs Immature Granulocytes: 0.04 10*3/uL (ref 0.00–0.07)
Basophils Absolute: 0 10*3/uL (ref 0.0–0.1)
Basophils Relative: 0 %
Eosinophils Absolute: 0 10*3/uL (ref 0.0–0.5)
Eosinophils Relative: 0 %
HCT: 40.6 % (ref 39.0–52.0)
Hemoglobin: 11.6 g/dL — ABNORMAL LOW (ref 13.0–17.0)
Immature Granulocytes: 0 %
Lymphocytes Relative: 8 %
Lymphs Abs: 0.8 10*3/uL (ref 0.7–4.0)
MCH: 20.9 pg — ABNORMAL LOW (ref 26.0–34.0)
MCHC: 28.6 g/dL — ABNORMAL LOW (ref 30.0–36.0)
MCV: 73.3 fL — ABNORMAL LOW (ref 80.0–100.0)
Monocytes Absolute: 0.3 10*3/uL (ref 0.1–1.0)
Monocytes Relative: 3 %
Neutro Abs: 8.9 10*3/uL — ABNORMAL HIGH (ref 1.7–7.7)
Neutrophils Relative %: 89 %
Platelets: 375 10*3/uL (ref 150–400)
RBC: 5.54 MIL/uL (ref 4.22–5.81)
RDW: 20.1 % — ABNORMAL HIGH (ref 11.5–15.5)
WBC: 10 10*3/uL (ref 4.0–10.5)
nRBC: 0 % (ref 0.0–0.2)

## 2019-04-15 LAB — MAGNESIUM
Magnesium: 2.1 mg/dL (ref 1.7–2.4)
Magnesium: 2.2 mg/dL (ref 1.7–2.4)

## 2019-04-15 LAB — COMPREHENSIVE METABOLIC PANEL
ALT: 40 U/L (ref 0–44)
AST: 45 U/L — ABNORMAL HIGH (ref 15–41)
Albumin: 2.9 g/dL — ABNORMAL LOW (ref 3.5–5.0)
Alkaline Phosphatase: 94 U/L (ref 38–126)
Anion gap: 10 (ref 5–15)
BUN: 16 mg/dL (ref 6–20)
CO2: 26 mmol/L (ref 22–32)
Calcium: 8.4 mg/dL — ABNORMAL LOW (ref 8.9–10.3)
Chloride: 105 mmol/L (ref 98–111)
Creatinine, Ser: 0.7 mg/dL (ref 0.61–1.24)
GFR calc Af Amer: >60 mL/min (ref >60)
GFR calc non Af Amer: >60 mL/min (ref >60)
Glucose, Bld: 137 mg/dL — ABNORMAL HIGH (ref 70–99)
Potassium: 4 mmol/L (ref 3.5–5.1)
Sodium: 141 mmol/L (ref 135–145)
Total Bilirubin: 0.4 mg/dL (ref 0.3–1.2)
Total Protein: 7.4 g/dL (ref 6.5–8.1)

## 2019-04-15 LAB — TYPE AND SCREEN
ABO/RH(D): O POS
Antibody Screen: NEGATIVE

## 2019-04-15 LAB — MRSA PCR SCREENING: MRSA by PCR: NEGATIVE

## 2019-04-15 LAB — PHOSPHORUS
Phosphorus: 2.8 mg/dL (ref 2.5–4.6)
Phosphorus: 2.9 mg/dL (ref 2.5–4.6)

## 2019-04-15 LAB — ABO/RH
ABO/RH(D): O POS
ABO/RH(D): O POS

## 2019-04-15 LAB — FERRITIN: Ferritin: 54 ng/mL (ref 24–336)

## 2019-04-15 MED ORDER — ONDANSETRON HCL 4 MG PO TABS: 4.0000 mg | ORAL_TABLET | Freq: Four times a day (QID) | ORAL | Status: DC | PRN

## 2019-04-15 MED ORDER — PAROXETINE HCL 20 MG PO TABS
20.0000 mg | ORAL_TABLET | Freq: Every day | ORAL | Status: DC
  Administered 2019-04-15 – 2019-04-17 (×3): 20 mg via ORAL
  Filled 2019-04-15 (×4): qty 1

## 2019-04-15 MED ORDER — FAMOTIDINE 20 MG PO TABS
20.0000 mg | ORAL_TABLET | Freq: Every day | ORAL | Status: DC
  Administered 2019-04-15 – 2019-04-17 (×3): 20 mg via ORAL
  Filled 2019-04-15 (×3): qty 1

## 2019-04-15 MED ORDER — SODIUM CHLORIDE 0.9 % IV SOLN
INTRAVENOUS | Status: DC
  Administered 2019-04-15 – 2019-04-19 (×4): via INTRAVENOUS

## 2019-04-15 MED ORDER — SODIUM CHLORIDE 0.9% IV SOLUTION
Freq: Once | INTRAVENOUS | Status: AC
  Administered 2019-04-15: 17:00:00 via INTRAVENOUS

## 2019-04-15 MED ORDER — ACETAMINOPHEN 325 MG PO TABS
650.0000 mg | ORAL_TABLET | Freq: Once | ORAL | Status: AC
  Administered 2019-04-15: 17:00:00 650 mg via ORAL
  Filled 2019-04-15: qty 2

## 2019-04-15 MED ORDER — ACETAMINOPHEN 325 MG PO TABS
650.0000 mg | ORAL_TABLET | Freq: Four times a day (QID) | ORAL | Status: DC | PRN
  Administered 2019-04-17 – 2019-04-20 (×8): 650 mg via ORAL
  Filled 2019-04-15 (×8): qty 2

## 2019-04-15 MED ORDER — DIPHENHYDRAMINE HCL 50 MG/ML IJ SOLN
25.0000 mg | Freq: Once | INTRAMUSCULAR | Status: AC
  Administered 2019-04-15: 25 mg via INTRAVENOUS
  Filled 2019-04-15: qty 1

## 2019-04-15 MED ORDER — DEXAMETHASONE SODIUM PHOSPHATE 10 MG/ML IJ SOLN
6.0000 mg | INTRAMUSCULAR | Status: DC
  Administered 2019-04-15: 6 mg via INTRAVENOUS
  Filled 2019-04-15 (×2): qty 1

## 2019-04-15 MED ORDER — SODIUM CHLORIDE 0.9 % IV SOLN
100.0000 mg | INTRAVENOUS | Status: AC
  Administered 2019-04-16 – 2019-04-18 (×3): 100 mg via INTRAVENOUS
  Filled 2019-04-15 (×3): qty 100

## 2019-04-15 MED ORDER — ADULT MULTIVITAMIN W/MINERALS CH
1.0000 | ORAL_TABLET | Freq: Every day | ORAL | Status: DC
  Administered 2019-04-15 – 2019-04-17 (×3): 1 via ORAL
  Filled 2019-04-15 (×3): qty 1

## 2019-04-15 MED ORDER — MIRTAZAPINE 15 MG PO TABS
15.0000 mg | ORAL_TABLET | Freq: Every day | ORAL | Status: DC
  Administered 2019-04-15 – 2019-04-16 (×2): 15 mg via ORAL
  Filled 2019-04-15 (×3): qty 1

## 2019-04-15 MED ORDER — HYDROCOD POLST-CPM POLST ER 10-8 MG/5ML PO SUER
5.0000 mL | Freq: Two times a day (BID) | ORAL | Status: DC | PRN
  Administered 2019-04-15: 5 mL via ORAL
  Filled 2019-04-15: qty 5

## 2019-04-15 MED ORDER — LORAZEPAM 2 MG/ML IJ SOLN
1.0000 mg | INTRAMUSCULAR | Status: DC | PRN
  Administered 2019-04-16: 4 mg via INTRAVENOUS
  Filled 2019-04-15: qty 2

## 2019-04-15 MED ORDER — METOPROLOL SUCCINATE ER 100 MG PO TB24
100.0000 mg | ORAL_TABLET | Freq: Every day | ORAL | Status: DC
  Administered 2019-04-15 – 2019-04-16 (×2): 100 mg via ORAL
  Filled 2019-04-15 (×4): qty 1

## 2019-04-15 MED ORDER — LURASIDONE HCL 40 MG PO TABS
80.0000 mg | ORAL_TABLET | Freq: Every day | ORAL | Status: DC
  Administered 2019-04-15 – 2019-04-17 (×3): 80 mg via ORAL
  Filled 2019-04-15 (×4): qty 2

## 2019-04-15 MED ORDER — SODIUM CHLORIDE 0.9 % IV SOLN
1.0000 g | INTRAVENOUS | Status: AC
  Administered 2019-04-15 – 2019-04-19 (×5): 1 g via INTRAVENOUS
  Filled 2019-04-15 (×3): qty 1
  Filled 2019-04-15: qty 10
  Filled 2019-04-15: qty 1

## 2019-04-15 MED ORDER — INFLUENZA VAC SPLIT QUAD 0.5 ML IM SUSY
0.5000 mL | PREFILLED_SYRINGE | INTRAMUSCULAR | Status: DC
  Filled 2019-04-15: qty 0.5

## 2019-04-15 MED ORDER — ENOXAPARIN SODIUM 40 MG/0.4ML ~~LOC~~ SOLN
40.0000 mg | SUBCUTANEOUS | Status: DC
  Administered 2019-04-15: 40 mg via SUBCUTANEOUS
  Filled 2019-04-15: qty 0.4

## 2019-04-15 MED ORDER — INFLUENZA VAC SPLIT QUAD 0.5 ML IM SUSY
0.5000 mL | PREFILLED_SYRINGE | INTRAMUSCULAR | Status: DC | PRN
  Filled 2019-04-15: qty 0.5

## 2019-04-15 MED ORDER — ENOXAPARIN SODIUM 60 MG/0.6ML ~~LOC~~ SOLN
50.0000 mg | Freq: Two times a day (BID) | SUBCUTANEOUS | Status: DC
  Administered 2019-04-15 – 2019-04-22 (×14): 50 mg via SUBCUTANEOUS
  Filled 2019-04-15 (×14): qty 0.6

## 2019-04-15 MED ORDER — IPRATROPIUM BROMIDE HFA 17 MCG/ACT IN AERS
2.0000 | INHALATION_SPRAY | Freq: Four times a day (QID) | RESPIRATORY_TRACT | Status: DC
  Administered 2019-04-15 – 2019-04-16 (×5): 2 via RESPIRATORY_TRACT
  Filled 2019-04-15: qty 12.9

## 2019-04-15 MED ORDER — FUROSEMIDE 10 MG/ML IJ SOLN
40.0000 mg | Freq: Once | INTRAMUSCULAR | Status: AC
  Administered 2019-04-15: 12:00:00 40 mg via INTRAVENOUS
  Filled 2019-04-15: qty 4

## 2019-04-15 MED ORDER — ONDANSETRON HCL 4 MG/2ML IJ SOLN
4.0000 mg | Freq: Four times a day (QID) | INTRAMUSCULAR | Status: DC | PRN
  Administered 2019-04-16: 4 mg via INTRAVENOUS
  Filled 2019-04-15: qty 2

## 2019-04-15 MED ORDER — THIAMINE HCL 100 MG/ML IJ SOLN: 100.0000 mg | Freq: Every day | INTRAMUSCULAR | Status: DC

## 2019-04-15 MED ORDER — METHYLPREDNISOLONE SODIUM SUCC 40 MG IJ SOLR
40.0000 mg | Freq: Two times a day (BID) | INTRAMUSCULAR | Status: DC
  Administered 2019-04-15 – 2019-04-22 (×15): 40 mg via INTRAVENOUS
  Filled 2019-04-15 (×15): qty 1

## 2019-04-15 MED ORDER — SODIUM CHLORIDE 0.9 % IV SOLN
100.0000 mg | INTRAVENOUS | Status: AC
  Administered 2019-04-15: 100 mg via INTRAVENOUS
  Filled 2019-04-15: qty 100

## 2019-04-15 MED ORDER — CHLORHEXIDINE GLUCONATE CLOTH 2 % EX PADS
6.0000 | MEDICATED_PAD | Freq: Every day | CUTANEOUS | Status: DC
  Administered 2019-04-15 – 2019-04-22 (×9): 6 via TOPICAL

## 2019-04-15 MED ORDER — ORAL CARE MOUTH RINSE
15.0000 mL | Freq: Two times a day (BID) | OROMUCOSAL | Status: DC
  Administered 2019-04-15 – 2019-04-16 (×2): 15 mL via OROMUCOSAL

## 2019-04-15 MED ORDER — FOLIC ACID 1 MG PO TABS
1.0000 mg | ORAL_TABLET | Freq: Every day | ORAL | Status: DC
  Administered 2019-04-15 – 2019-04-17 (×3): 1 mg via ORAL
  Filled 2019-04-15 (×3): qty 1

## 2019-04-15 MED ORDER — VITAMIN B-1 100 MG PO TABS
100.0000 mg | ORAL_TABLET | Freq: Every day | ORAL | Status: DC
  Administered 2019-04-15 – 2019-04-17 (×3): 100 mg via ORAL
  Filled 2019-04-15 (×3): qty 1

## 2019-04-15 MED ORDER — AZITHROMYCIN 500 MG PO TABS
500.0000 mg | ORAL_TABLET | Freq: Every day | ORAL | Status: DC
  Administered 2019-04-15 – 2019-04-17 (×3): 500 mg via ORAL
  Filled 2019-04-15 (×4): qty 1

## 2019-04-15 MED ORDER — LORAZEPAM 0.5 MG PO TABS
1.0000 mg | ORAL_TABLET | ORAL | Status: DC | PRN
  Administered 2019-04-16 (×2): 1 mg via ORAL
  Filled 2019-04-15 (×2): qty 2

## 2019-04-15 MED ORDER — GUAIFENESIN-DM 100-10 MG/5ML PO SYRP
10.0000 mL | ORAL_SOLUTION | ORAL | Status: DC | PRN
  Administered 2019-04-15 – 2019-04-16 (×2): 10 mL via ORAL
  Filled 2019-04-15 (×2): qty 10

## 2019-04-15 NOTE — Progress Notes (Signed)
Patient arrived from [facility] ED via [place] on [address].  Placed on [place]  settings from previous facility 30L 60% FIO2. Dyspnea at rest.

## 2019-04-15 NOTE — ED Notes (Signed)
[facility] called to transport patient  

## 2019-04-15 NOTE — Progress Notes (Signed)
Patient alert and oriented and states not to contact wife for update at this time.

## 2019-04-15 NOTE — Progress Notes (Addendum)
Patient with [id number] cash in wallet to be locked in safe with security. Counted at bedside with patient verified by myself and [employer].   Envelope # [phone]

## 2019-04-15 NOTE — Significant Event (Addendum)
Reportedly patient drinks about 1 liter of tequila a day.  Not withdrawing acutely, but will put on CIWA.

## 2019-04-15 NOTE — Progress Notes (Signed)
eLink Physician-Brief Progress Note Patient Name: Spencer Richard DOB: 01/25/1973 MRN: [id number]   Date of Service  04/15/2019  HPI/Events of Note  52 old male with hx of HTN, Obese, depression admitted for Covid + Multifocal PNA.   Camera; HR ok. 89% on 30 Li HFNC. MAP 110. Obese. Looks ok. Dry cough+. Discussed with bed side RN. No need any intervention from [place] at this time.   Data: Reviewed.    eICU Interventions  - on antivirals. - on HFNC - awake prone as tolerated if possible. - follow ferrtin, LA - on VTE-lovenox - on nebs - received rocephin/azithromycine. - on remdesivir/setroids. -dexa.      Intervention Category Major Interventions: Respiratory failure - evaluation and management Evaluation Type: New Patient Evaluation  Elmer Sow 04/15/2019, 5:21 AM

## 2019-04-15 NOTE — Progress Notes (Addendum)
PROGRESS NOTE                                                                                                                                                                                                             Patient Demographics:    Spencer Richard, is a 58 y.o. male, DOB - 01/25/1973, RUE:[id number]  Outpatient Primary MD for the patient is Pt, No Pcp Per (Inactive)   Admit date - 04/21/2019   LOS - 1  Chief Complaint  Patient presents with  . fever/ low sats       Brief Narrative: Patient is a 58 y.o. male with PMHx of depression, anxiety, hypertension, obesity-alcohol abuse-presented with a 3 to 5-day history of worsening shortness of breath, myalgias, fever, cough-found to have severe hypoxemia secondary to COVID-19 pneumonia-[facility].  See below for further details.     [facility]ive:    [facility]-he still is on heated high flow.  He is sitting up-very comfortable-not in any sort of distress.   Assessment  & Plan :   Acute Hypoxic Resp Failure due to Covid 19 Viral pneumonia: Requiring heated high flow at 70% FiO2 to maintain O2 saturation-continue steroids and remdesivir.  Have discussed rationale, risk and benefits of convalescent plasma-he agrees.  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  Monitor closely in the ICU-currently stable and very comfortable-if he develops respiratory distress-we will then formally consult PCCM.  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  Prone/Incentive Spirometry: encouraged patient to lie prone for 3-4 hours at a time for a total of 16 hours a day, and to encourage incentive spirometry use 3-4/hour.  DVT Prophylaxis  :  Lovenox   EtOH abuse: Claims last drink was 3-4 days back-as he was getting second not able to drink EtOH.  But claims that he does both tequila and beer (unquantified amount) on a daily basis.  Currently no signs of withdrawal.  On Ativan per CIWA protocol.  Remains on MVI/thiamine  HTN: Controlled-continue Toprol  Depression: Stable currently-continue Paxil, Remeron and Latuda  Obesity: Estimated body mass index is 39.82 kg/m as calculated from the following:   Height as of this encounter:  (1.549 m).   Weight as of this encounter: 95.6 kg.    ABG: No results found for: PHART, PCO2ART, PO2ART, HCO3, TCO2, ACIDBASEDEF, O2SAT  Vent Settings: N/A  Condition - Extremely Guarded-very tenuous with risk for further deterioration  Family Communication  : We will attempt to update family/friend later-per patient-he does not have any immediate family members in town.  Code Status :  Full Code  Diet :  Diet Order            Diet Heart Room service appropriate? Yes; Fluid consistency: Thin  Diet effective now               Disposition Plan  :  Remain hospitalized  Barriers to discharge: Hypoxia requiring O2 supplementation/complete 5 days of IV Remdesivir  Consults  :  PCCM  Procedures  :  None  GI prophylaxis: H2 Blocker  Antibiotics  :    Anti-infectives  (From admission, onward)   Start     Dose/Rate Route Frequency Ordered Stop   04/16/19 1000  remdesivir 100 mg in sodium chloride 0.9 % 250 mL IVPB     100 mg 500 mL/hr over 30 Minutes Intravenous Every 24 hours 04/15/19 0324 04/19/19 0959   04/15/19 2200  remdesivir 100 mg in sodium chloride 0.9 % 250 mL IVPB  Status:  Discontinued     100 mg 500 mL/hr over 30 Minutes Intravenous Daily at bedtime 2019/04/19 2311 04/15/19 0324   04/15/19 1600  remdesivir 100 mg in sodium chloride 0.9 % 250 mL IVPB     100 mg 500 mL/hr over 30 Minutes Intravenous Every 24 hours 04/15/19 0324 04/16/19 1559   04/15/19 1000  azithromycin (ZITHROMAX) tablet 500 mg     500 mg Oral Daily 04/15/19 0241     04/15/19 0240  cefTRIAXone (ROCEPHIN) 1 g in sodium chloride 0.9 % 100 mL IVPB     1 g 200 mL/hr over 30 Minutes Intravenous Every 24 hours 04/15/19 0241     Apr 19, 2019 2330  remdesivir 200 mg in sodium chloride 0.9 % 250 mL IVPB     200 mg 500 mL/hr over 30 Minutes Intravenous Once 04-19-19 2311 04/15/19 0245      Inpatient Medications  Scheduled Meds: . sodium chloride   Intravenous Once  . acetaminophen  650 mg Oral Once  . azithromycin  500 mg Oral Daily  . Chlorhexidine Gluconate Cloth  6 each Topical Daily  . diphenhydrAMINE  25 mg Intravenous Once  . enoxaparin (LOVENOX) injection  40 mg Subcutaneous Q24H  . folic acid  1 mg Oral Daily  . ipratropium  2 puff Inhalation Q6H  . lurasidone  80 mg Oral Q breakfast  . mouth rinse  15 mL Mouth Rinse BID  . methylPREDNISolone (SOLU-MEDROL)  injection  40 mg Intravenous Q12H  . metoprolol succinate  100 mg Oral Daily  . mirtazapine  15 mg Oral QHS  . multivitamin with minerals  1 tablet Oral Daily  . PARoxetine  20 mg Oral Daily  . thiamine  100 mg Oral Daily   Or  . thiamine  100 mg Intravenous Daily   Continuous Infusions: . sodium chloride 10 mL/hr at 04/15/19 0900  . cefTRIAXone (ROCEPHIN)  IV Stopped (04/15/19 0329)  . remdesivir 100 mg in NS  250 mL    . [START ON 04/16/2019] remdesivir 100 mg in NS 250 mL     PRN Meds:.acetaminophen, chlorpheniramine-HYDROcodone, guaiFENesin-dextromethorphan, influenza vac split quadrivalent PF, LORazepam **OR** LORazepam, ondansetron **OR** ondansetron (ZOFRAN) IV   Time Spent in minutes  45  The patient is critically ill with multiple organ system failure and requires high complexity decision making for assessment and support, frequent evaluation and titration of therapies, advanced monitoring, review of radiographic studies and interpretation of complex data.   See all Orders from today for further details   Jeoffrey Massed M.D on 04/15/2019 at 12:03 PM  To page go to [email or web address] - use universal password  [facility] -  Office  [phone]    Objective:   Vitals:   04/15/19 0700 04/15/19 0800 04/15/19 0833 04/15/19 0900  BP: (!) 157/90 (!) 166/88 (!) 166/88 (!) 157/87  Pulse: 79 85 83 90  Resp: (!) 36 (!) 26 (!) 28 (!) 41  Temp:  99.2 F (37.3 C)    TempSrc:  Oral    SpO2: 96% 90% 93% (!) 89%  Weight:      Height:        Wt Readings from Last 3 Encounters:  04/15/19 95.6 kg  11/12/18 81.6 kg     Intake/Output Summary (Last 24 hours) at 04/15/2019 1203 Last data filed at 04/15/2019 0900 Gross per 24 hour  Intake 874.88 ml  Output 475 ml  Net 399.88 ml     Physical Exam Gen Exam:Alert awake-not in any distress HEENT:atraumatic, normocephalic Chest: B/L clear to auscultation anteriorly CVS:S1S2 regular Abdomen:soft non tender, non distended Extremities:no edema Neurology: Non focal Skin: no rash   Data Review:    CBC Recent Labs  Lab Apr 27, 2019 1640 04/15/19 0900  WBC 8.9 10.0  HGB 11.8* 11.6*  HCT 40.9 40.6  PLT 308 375  MCV 71.8* 73.3*  MCH 20.7* 20.9*  MCHC 28.9* 28.6*  RDW 19.7* 20.1*  LYMPHSABS 0.7 0.8  MONOABS 0.2 0.3  EOSABS 0.0 0.0  BASOSABS 0.0 0.0    Chemistries  Recent Labs  Lab 04-27-19 1640 04/15/19 0900  NA 139  141  K 3.6 4.0  CL 101 105  CO2 26 26  GLUCOSE 117* 137*  BUN 12 16  CREATININE 0.91 0.70  CALCIUM 8.2* 8.4*  MG  --  2.2  AST 68* 45*  ALT 51* 40  ALKPHOS 100 94  BILITOT 0.5 0.4   ------------------------------------------------------------------------------------------------------------------ Recent Labs    04/27/2019 1640  TRIG 79    No results found for: HGBA1C ------------------------------------------------------------------------------------------------------------------ No results for input(s): TSH, T4TOTAL, T3FREE, THYROIDAB in the last 72 hours.  Invalid input(s): FREET3 ------------------------------------------------------------------------------------------------------------------ Recent Labs    27-Apr-2019 1640 04/15/19 0900  FERRITIN 62 54    Coagulation profile No results for input(s): INR, PROTIME in the last 168 hours.  Recent Labs    2019/04/27 1640  DDIMER 0.99*    Cardiac Enzymes No results for input(s): CKMB, TROPONINI, MYOGLOBIN in the last  168 hours.  Invalid input(s): CK ------------------------------------------------------------------------------------------------------------------ No results found for: BNP  Micro Results Recent Results (from the past 240 hour(s))  SARS CORONAVIRUS 2 (TAT 6-24 HRS) Nasopharyngeal Nasopharyngeal Swab     Status: Abnormal   Collection Time: May 14, 2019  4:36 PM   Specimen: Nasopharyngeal Swab  Result Value Ref Range Status   SARS Coronavirus 2 POSITIVE (A) NEGATIVE Final    Comment: RESULT CALLED TO, READ BACK BY AND VERIFIED WITH: Teofilo Pod, RN AT 2248 ON 05/14/2019 BY SAINVILUS S (NOTE) SARS-CoV-2 target nucleic acids are DETECTED. The SARS-CoV-2 RNA is generally detectable in upper and lower respiratory specimens during the acute phase of infection. Positive results are indicative of active infection with SARS-CoV-2. Clinical  correlation with patient history and other diagnostic information is  necessary to determine patient infection status. Positive results do  not rule out bacterial infection or co-infection with other viruses. The expected result is Negative. Fact Sheet for Patients: SugarRoll.be Fact Sheet for Healthcare Providers: https://www.woods-mathews.com/ This test is not yet approved or cleared by the [place] FDA and  has been authorized for detection and/or diagnosis of SARS-CoV-2 by FDA under an Emergency Use Authorization (EUA). This EUA will remain  in effect (meaning this test can  be used) for the duration of the COVID-19 declaration under Section 564(b)(1) of the Act, 21 U.S.C. section 360bbb-3(b)(1), unless the authorization is terminated or revoked sooner. Performed at [facility], [place] [address], [place], [place] [address]   Blood Culture (routine x 2)     Status: None (Preliminary result)   Collection Time: 05-14-2019  4:40 PM   Specimen: BLOOD  Result Value Ref Range Status   Specimen Description BLOOD RIGHT ANTECUBITAL  Final   Special Requests   Final    BOTTLES DRAWN AEROBIC AND ANAEROBIC Blood Culture adequate volume   Culture   Final    NO GROWTH < 24 HOURS Performed at [facility], [place] [address], [place], [place] [address]    Report Status PENDING  Incomplete  Blood Culture (routine x 2)     Status: None (Preliminary result)   Collection Time: May 14, 2019  4:48 PM   Specimen: BLOOD LEFT HAND  Result Value Ref Range Status   Specimen Description BLOOD LEFT HAND  Final   Special Requests   Final    BOTTLES DRAWN AEROBIC AND ANAEROBIC Blood Culture results may not be optimal due to an inadequate volume of blood received in culture bottles   Culture   Final    NO GROWTH < 24 HOURS Performed at [facility], [place] [address] [place], [place] [address]    Report Status PENDING  Incomplete  MRSA PCR Screening     Status: None   Collection Time: 04/15/19  5:40 AM   Specimen:  Nasopharyngeal  Result Value Ref Range Status   MRSA by PCR NEGATIVE NEGATIVE Final    Comment:        The GeneXpert MRSA Assay (FDA approved for NASAL specimens only), is one component of a comprehensive MRSA colonization surveillance program. It is not intended to diagnose MRSA infection nor to guide or monitor treatment for MRSA infections. Performed at [facility], [place] [address], [place], [place] [address]     Radiology Reports Dg Chest Port 1 View  Result Date: May 14, 2019 CLINICAL DATA:  Shortness of breath, fever, weakness EXAM: PORTABLE CHEST 1 VIEW COMPARISON:  11/11/2018 FINDINGS: Cardiomegaly. Extensive bilateral interstitial and heterogeneous pulmonary opacity.  The visualized skeletal structures are unremarkable. IMPRESSION: 1. Extensive bilateral interstitial and heterogeneous pulmonary opacity, consistent with multifocal infection. 2.  Cardiomegaly. Electronically Signed   By: Lauralyn Primes M.D.   On: 17-Apr-2019 17:54

## 2019-04-16 ENCOUNTER — Inpatient Hospital Stay (HOSPITAL_COMMUNITY): Payer: HRSA Program

## 2019-04-16 DIAGNOSIS — U071 COVID-19: Principal | ICD-10-CM

## 2019-04-16 DIAGNOSIS — J9601 Acute respiratory failure with hypoxia: Secondary | ICD-10-CM

## 2019-04-16 DIAGNOSIS — G9340 Encephalopathy, unspecified: Secondary | ICD-10-CM

## 2019-04-16 DIAGNOSIS — F10231 Alcohol dependence with withdrawal delirium: Secondary | ICD-10-CM

## 2019-04-16 DIAGNOSIS — J1289 Other viral pneumonia: Secondary | ICD-10-CM

## 2019-04-16 LAB — COMPREHENSIVE METABOLIC PANEL
ALT: 34 U/L (ref 0–44)
AST: 34 U/L (ref 15–41)
Albumin: 3.1 g/dL — ABNORMAL LOW (ref 3.5–5.0)
Alkaline Phosphatase: 94 U/L (ref 38–126)
Anion gap: 9 (ref 5–15)
BUN: 24 mg/dL — ABNORMAL HIGH (ref 6–20)
CO2: 33 mmol/L — ABNORMAL HIGH (ref 22–32)
Calcium: 8.6 mg/dL — ABNORMAL LOW (ref 8.9–10.3)
Chloride: 103 mmol/L (ref 98–111)
Creatinine, Ser: 0.81 mg/dL (ref 0.61–1.24)
GFR calc Af Amer: >60 mL/min (ref >60)
GFR calc non Af Amer: >60 mL/min (ref >60)
Glucose, Bld: 121 mg/dL — ABNORMAL HIGH (ref 70–99)
Potassium: 3.9 mmol/L (ref 3.5–5.1)
Sodium: 145 mmol/L (ref 135–145)
Total Bilirubin: 0.8 mg/dL (ref 0.3–1.2)
Total Protein: 7.4 g/dL (ref 6.5–8.1)

## 2019-04-16 LAB — CBC WITH DIFFERENTIAL/PLATELET
Abs Immature Granulocytes: 0.05 10*3/uL (ref 0.00–0.07)
Basophils Absolute: 0 10*3/uL (ref 0.0–0.1)
Basophils Relative: 0 %
Eosinophils Absolute: 0 10*3/uL (ref 0.0–0.5)
Eosinophils Relative: 0 %
HCT: 40.3 % (ref 39.0–52.0)
Hemoglobin: 11.1 g/dL — ABNORMAL LOW (ref 13.0–17.0)
Immature Granulocytes: 0 %
Lymphocytes Relative: 9 %
Lymphs Abs: 1 10*3/uL (ref 0.7–4.0)
MCH: 20.4 pg — ABNORMAL LOW (ref 26.0–34.0)
MCHC: 27.5 g/dL — ABNORMAL LOW (ref 30.0–36.0)
MCV: 73.9 fL — ABNORMAL LOW (ref 80.0–100.0)
Monocytes Absolute: 0.6 10*3/uL (ref 0.1–1.0)
Monocytes Relative: 5 %
Neutro Abs: 9.9 10*3/uL — ABNORMAL HIGH (ref 1.7–7.7)
Neutrophils Relative %: 86 %
Platelets: 478 10*3/uL — ABNORMAL HIGH (ref 150–400)
RBC: 5.45 MIL/uL (ref 4.22–5.81)
RDW: 20.1 % — ABNORMAL HIGH (ref 11.5–15.5)
WBC: 11.5 10*3/uL — ABNORMAL HIGH (ref 4.0–10.5)
nRBC: 0.2 % (ref 0.0–0.2)

## 2019-04-16 LAB — D-DIMER, QUANTITATIVE: D-Dimer, Quant: 1.48 ug/mL-FEU — ABNORMAL HIGH (ref 0.00–0.50)

## 2019-04-16 LAB — PREPARE FRESH FROZEN PLASMA: Unit division: 0

## 2019-04-16 LAB — POCT I-STAT 7, (LYTES, BLD GAS, ICA,H+H)
Acid-Base Excess: 8 mmol/L — ABNORMAL HIGH (ref 0.0–2.0)
Bicarbonate: 38 mmol/L — ABNORMAL HIGH (ref 20.0–28.0)
Calcium, Ion: 1.19 mmol/L (ref 1.15–1.40)
HCT: 39 % (ref 39.0–52.0)
Hemoglobin: 13.3 g/dL (ref 13.0–17.0)
O2 Saturation: 97 %
Patient temperature: 98.6
Potassium: 4 mmol/L (ref 3.5–5.1)
Sodium: 143 mmol/L (ref 135–145)
TCO2: 41 mmol/L — ABNORMAL HIGH (ref 22–32)
pCO2 arterial: 85 mmHg (ref 32.0–48.0)
pH, Arterial: 7.258 — ABNORMAL LOW (ref 7.350–7.450)
pO2, Arterial: 109 mmHg — ABNORMAL HIGH (ref 83.0–108.0)

## 2019-04-16 LAB — BPAM FFP
Blood Product Expiration Date: 202011171549
ISSUE DATE / TIME: 202011161609
Unit Type and Rh: 5100

## 2019-04-16 LAB — HIV ANTIBODY (ROUTINE TESTING W REFLEX): HIV Screen 4th Generation wRfx: NONREACTIVE — AB

## 2019-04-16 LAB — FERRITIN: Ferritin: 46 ng/mL (ref 24–336)

## 2019-04-16 LAB — GLUCOSE, CAPILLARY: Glucose-Capillary: 123 mg/dL — ABNORMAL HIGH (ref 70–99)

## 2019-04-16 LAB — C-REACTIVE PROTEIN: CRP: 10.6 mg/dL — ABNORMAL HIGH (ref <1.0)

## 2019-04-16 MED ORDER — ROCURONIUM BROMIDE 10 MG/ML (PF) SYRINGE
PREFILLED_SYRINGE | INTRAVENOUS | Status: AC
  Administered 2019-04-16: 50 mg
  Filled 2019-04-16: qty 10

## 2019-04-16 MED ORDER — PRO-STAT SUGAR FREE PO LIQD
30.0000 mL | Freq: Three times a day (TID) | ORAL | Status: DC
  Administered 2019-04-16 – 2019-04-22 (×18): 30 mL
  Filled 2019-04-16 (×16): qty 30

## 2019-04-16 MED ORDER — ROCURONIUM BROMIDE 10 MG/ML (PF) SYRINGE: 50.0000 mg | PREFILLED_SYRINGE | Freq: Once | INTRAVENOUS | Status: AC

## 2019-04-16 MED ORDER — DEXMEDETOMIDINE HCL IN NACL 400 MCG/100ML IV SOLN
0.0000 ug/kg/h | INTRAVENOUS | Status: DC
  Administered 2019-04-16: 0.2 ug/kg/h via INTRAVENOUS
  Filled 2019-04-16: qty 100

## 2019-04-16 MED ORDER — CHLORHEXIDINE GLUCONATE 0.12% ORAL RINSE (MEDLINE KIT)
15.0000 mL | Freq: Two times a day (BID) | OROMUCOSAL | Status: DC
  Administered 2019-04-16 – 2019-04-22 (×12): 15 mL via OROMUCOSAL

## 2019-04-16 MED ORDER — HYDRALAZINE HCL 20 MG/ML IJ SOLN: 10.0000 mg | Freq: Four times a day (QID) | INTRAMUSCULAR | Status: DC | PRN

## 2019-04-16 MED ORDER — ETOMIDATE 2 MG/ML IV SOLN
INTRAVENOUS | Status: AC
  Administered 2019-04-16: 20 mg via INTRAVENOUS
  Filled 2019-04-16: qty 20

## 2019-04-16 MED ORDER — MIDAZOLAM 50MG/50ML (1MG/ML) PREMIX INFUSION
0.5000 mg/h | INTRAVENOUS | Status: DC
  Administered 2019-04-16: 3 mg/h via INTRAVENOUS
  Administered 2019-04-17: 2 mg/h via INTRAVENOUS
  Administered 2019-04-17: 3 mg/h via INTRAVENOUS
  Administered 2019-04-18 – 2019-04-19 (×2): 3.5 mg/h via INTRAVENOUS
  Administered 2019-04-19 – 2019-04-20 (×3): 4 mg/h via INTRAVENOUS
  Filled 2019-04-16 (×9): qty 50

## 2019-04-16 MED ORDER — FUROSEMIDE 10 MG/ML IJ SOLN
40.0000 mg | Freq: Once | INTRAMUSCULAR | Status: AC
  Administered 2019-04-16: 40 mg via INTRAVENOUS
  Filled 2019-04-16: qty 4

## 2019-04-16 MED ORDER — MIDAZOLAM HCL 2 MG/2ML IJ SOLN
2.0000 mg | Freq: Once | INTRAMUSCULAR | Status: AC
  Administered 2019-04-16: 13:00:00 2 mg via INTRAVENOUS

## 2019-04-16 MED ORDER — FENTANYL BOLUS VIA INFUSION
50.0000 ug | INTRAVENOUS | Status: DC | PRN
  Administered 2019-04-16 – 2019-04-20 (×8): 50 ug via INTRAVENOUS
  Filled 2019-04-16: qty 50

## 2019-04-16 MED ORDER — FENTANYL CITRATE (PF) 100 MCG/2ML IJ SOLN
INTRAMUSCULAR | Status: AC
  Administered 2019-04-16: 100 ug via INTRAVENOUS
  Filled 2019-04-16: qty 2

## 2019-04-16 MED ORDER — FENTANYL CITRATE (PF) 100 MCG/2ML IJ SOLN: 50.0000 ug | Freq: Once | INTRAMUSCULAR | Status: DC

## 2019-04-16 MED ORDER — VITAL HIGH PROTEIN PO LIQD
1000.0000 mL | ORAL | Status: DC
  Administered 2019-04-16 – 2019-04-22 (×5): 1000 mL

## 2019-04-16 MED ORDER — ATROPINE SULFATE 1 MG/10ML IJ SOSY
1.0000 mg | PREFILLED_SYRINGE | Freq: Once | INTRAMUSCULAR | Status: AC
  Administered 2019-04-16: 0.5 mg via INTRAVENOUS

## 2019-04-16 MED ORDER — MIDAZOLAM HCL 2 MG/2ML IJ SOLN
INTRAMUSCULAR | Status: AC
  Administered 2019-04-16: 2 mg via INTRAVENOUS
  Filled 2019-04-16: qty 4

## 2019-04-16 MED ORDER — FENTANYL CITRATE (PF) 100 MCG/2ML IJ SOLN
100.0000 ug | Freq: Once | INTRAMUSCULAR | Status: AC
  Administered 2019-04-16: 13:00:00 100 ug via INTRAVENOUS

## 2019-04-16 MED ORDER — FENTANYL 2500MCG IN NS 250ML (10MCG/ML) PREMIX INFUSION
0.0000 ug/h | INTRAVENOUS | Status: DC
  Administered 2019-04-16: 50 ug/h via INTRAVENOUS
  Administered 2019-04-17: 200 ug/h via INTRAVENOUS
  Administered 2019-04-17: 125 ug/h via INTRAVENOUS
  Administered 2019-04-18: 150 ug/h via INTRAVENOUS
  Administered 2019-04-19 (×2): 200 ug/h via INTRAVENOUS
  Administered 2019-04-20: 150 ug/h via INTRAVENOUS
  Administered 2019-04-20: 200 ug/h via INTRAVENOUS
  Administered 2019-04-21 (×3): 350 ug/h via INTRAVENOUS
  Administered 2019-04-22 (×2): 375 ug/h via INTRAVENOUS
  Filled 2019-04-16 (×14): qty 250

## 2019-04-16 MED ORDER — ETOMIDATE 2 MG/ML IV SOLN
20.0000 mg | Freq: Once | INTRAVENOUS | Status: AC
  Administered 2019-04-16: 13:00:00 20 mg via INTRAVENOUS

## 2019-04-16 MED ORDER — ORAL CARE MOUTH RINSE
15.0000 mL | OROMUCOSAL | Status: DC
  Administered 2019-04-16 – 2019-04-22 (×59): 15 mL via OROMUCOSAL

## 2019-04-16 NOTE — Progress Notes (Signed)
Initial Nutrition Assessment RD working remotely.  DOCUMENTATION CODES:   Obesity unspecified  INTERVENTION:    Vital High Protein at 50 ml/h (1200 ml per day)   Pro-stat 30 ml TID   Provides 1500 kcal, 150 gm protein, 1003 ml free water daily  NUTRITION DIAGNOSIS:   Increased nutrient needs related to acute illness(COVID 19) as evidenced by estimated needs.  GOAL:   Patient will meet greater than or equal to 90% of their needs  MONITOR:   TF tolerance, Labs, Vent status  REASON FOR ASSESSMENT:   Ventilator, Consult Enteral/tube feeding initiation and management  ASSESSMENT:   58 yo male admitted with acute hypoxic respiratory failure r/t COVID 19 PNA and ETOH withdrawal. PMH includes ETOH abuse, HTN.   Patient required intubation earlier today.  Received MD Consult for TF initiation and management. OG tube placed today.  Patient is currently intubated on ventilator support MV: 12 L/min Temp (24hrs), Avg:98.5 F (36.9 C), Min:97.8 F (36.6 C), Max:99 F (37.2 C)   Labs reviewed.  Medications reviewed and include folic acid, solumedrol, remeron, MVI, thiamine.  No recent weight encounters available for review.  NUTRITION - FOCUSED PHYSICAL EXAM:  deferred, working remotely  Diet Order:   Diet Order            Diet Heart Room service appropriate? Yes; Fluid consistency: Thin  Diet effective now              EDUCATION NEEDS:   Not appropriate for education at this time  Skin:  Skin Assessment: Reviewed RN Assessment  Last BM:  11/17  Height:   Ht Readings from Last 1 Encounters:  04/16/19 5\' 8"  (1.727 m)    Weight:   Wt Readings from Last 1 Encounters:  04/15/19 95.6 kg    Ideal Body Weight:  70 kg  BMI:  Body mass index is 32.05 kg/m.  Estimated Nutritional Needs:   Kcal:  1475  Protein:  140 gm  Fluid:  >/= 1.8 L    Molli Barrows, RD, LDN, [place] Pager [phone] After Hours Pager [phone]

## 2019-04-16 NOTE — Procedures (Addendum)
Intubation Procedure Note Spencer Richard [id number] 05/11/1961  Procedure: Intubation Indications: Airway protection and maintenance  Procedure Details Consent: Unable to obtain consent because of emergent medical necessity. Time Out: Verified patient identification, verified procedure, site/side was marked, verified correct patient position, special equipment/implants available, medications/allergies/relevent history reviewed, required imaging and test results available.  Performed  Maximum sterile technique was used including antiseptics, cap, gloves, gown, hand hygiene, mask and sheet.  MAC    Evaluation Hemodynamic Status: BP stable throughout; O2 sats: stable throughout Patient's Current Condition: stable Complications: No apparent complications Patient did tolerate procedure well. Chest X-ray ordered to verify placement.  CXR: tube position acceptable.   Spencer Richard 04/16/2019

## 2019-04-16 NOTE — Progress Notes (Signed)
Patient significantly more disoriented and confused with poor attention/receptiveness to directions. Patient stood up at bedside and voided in the floor, pulling off monitoring devices. Patient also more tachycardic with garbled speech and more visible tremors. Attempted to redirect multiple times. Followed CIWA protocol and gave 4mg  of IV ativan. MD made aware.

## 2019-04-16 NOTE — Progress Notes (Signed)
Bedside called and reported HTN, despite resting comfortably. Advised to try ativan d/t concerns for ETOH withdrawal.

## 2019-04-16 NOTE — Procedures (Signed)
OGT Placement By MD  OGT placed under direct laryngoscopy and verified by auscultation  Riddhi Grether G. Sachin Ferencz, M.D. [facility].  

## 2019-04-16 NOTE — Progress Notes (Addendum)
PROGRESS NOTE                                                                                                                                                                                                             Patient Demographics:    Spencer Richard, is a 58 y.o. male, DOB - [birth year]Rodert [id number]  Outpatient Primary MD for the patient is Pt, No Pcp Per (Inactive)   Admit date - 04/13/2019   LOS - 2  Chief Complaint  Patient presents with  . fever/ low sats       Brief Narrative: Patient is a 58 y.o. male with PMHx of depression, anxiety, hypertension, obesity-alcohol abuse-presented with a 3 to 5-day history of worsening shortness of breath, myalgias, fever, cough-found to have severe hypoxemia secondary to COVID-19 pneumonia-admitted to the ICU at [facility].  See below for further details.     Subjective:    Spencer Richard today remains essentially unchanged-still requiring a lot of oxygen to maintain O2 sats.  He appears comfortable.  He does not have any tremors.     Assessment  & Plan :   Acute Hypoxic Resp Failure due to Covid 19 Viral pneumonia: Still requiring 70% FiO2 via heated high flow O2 to maintain appropriate O2 sat.  Continue steroids/remdesivir-patient is s/p 1 unit of convalescent plasma on 11/16.  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  Continue close monitoring in the ICU-if he develops a respiratory distress, confusion etc we will consult PCCM for intubation.  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   Antibiotics: Procalcitonin elevated-we will plan  on 5 days total of Rocephin/Zithromax due to concern for superimposed bacterial infection.  Prone/Incentive Spirometry: encouraged patient to lie prone for 3-4 hours at a time for a total of 16 hours a day, and to encourage incentive spirometry use 3-4/hour.  DVT Prophylaxis  :  Lovenox   EtOH abuse: Claims last drink was 3-4 days prior to this [facility] stay-claims that since he started getting sick with Covid-he was not able to drink EtOH.  Currently awake-alert no tremors-continue as needed Ativan per CIWA protocol.  Continue MVI/thiamine.  HTN: Relatively well-controlled-continue Toprol.  Depression: Stable currently-continue Paxil, Remeron and Latuda  Obesity: Estimated body mass index is 39.82 kg/m as calculated from the following:   Height as of this encounter: 5\' 1"  (1.549 m).   Weight as of this encounter: 95.6 kg.    ABG: No results found for: PHART, PCO2ART, PO2ART, HCO3, TCO2, ACIDBASEDEF, O2SAT  Vent Settings: N/A  Condition - Extremely Guarded-very tenuous with risk for further deterioration  Family Communication  : Patient does not want me to contact his wife-he apparently will contact her himself  Code Status :  Full Code  Diet :  Diet Order            Diet Heart Room service appropriate? Yes; Fluid consistency: Thin  Diet effective now               Disposition Plan  :  Remain hospitalized-remain in the ICU  Barriers to discharge: Hypoxia requiring O2 supplementation/complete 5 days of IV Remdesivir  Consults  :  PCCM  Procedures  :  None  GI prophylaxis: H2 Blocker  Antibiotics   :    Anti-infectives (From admission, onward)   Start     Dose/Rate Route Frequency Ordered Stop   04/16/19 1000  remdesivir 100 mg in sodium chloride 0.9 % 250 mL IVPB     100 mg 500 mL/hr over 30 Minutes Intravenous Every 24 hours 04/15/19 0324 04/19/19 0959   04/15/19 2200  remdesivir 100 mg in sodium chloride 0.9 % 250 mL IVPB  Status:  Discontinued     100 mg 500 mL/hr over 30 Minutes Intravenous Daily at bedtime 04/13/2019 2311 04/15/19 0324   04/15/19 1600  remdesivir 100 mg in sodium chloride 0.9 % 250 mL IVPB     100 mg 500 mL/hr over 30 Minutes Intravenous Every 24 hours 04/15/19 0324 04/15/19 1617   04/15/19 1000  azithromycin (ZITHROMAX) tablet 500 mg     500 mg Oral Daily 04/15/19 0241 04/20/19 0959   04/15/19 0240  cefTRIAXone (ROCEPHIN) 1 g in sodium chloride 0.9 % 100 mL IVPB     1 g 200 mL/hr over 30 Minutes Intravenous Every 24 hours 04/15/19 0241 04/20/19 0244   04/18/2019 2330  remdesivir 200 mg in sodium chloride 0.9 % 250 mL IVPB     200 mg 500 mL/hr over 30 Minutes Intravenous Once 04/16/2019 2311 04/15/19 0245      Inpatient Medications  Scheduled Meds: . azithromycin  500 mg Oral Daily  . Chlorhexidine Gluconate Cloth  6 each Topical Daily  . enoxaparin (LOVENOX) injection  50 mg Subcutaneous Q12H  . famotidine  20 mg Oral Daily  . folic acid  1 mg Oral Daily  . ipratropium  2 puff Inhalation Q6H  . lurasidone  80 mg Oral Q breakfast  . mouth rinse  15 mL Mouth Rinse BID  . methylPREDNISolone (SOLU-MEDROL) injection  40 mg Intravenous Q12H  . metoprolol succinate  100  mg Oral Daily  . mirtazapine  15 mg Oral QHS  . multivitamin with minerals  1 tablet Oral Daily  . PARoxetine  20 mg Oral Daily  . thiamine  100 mg Oral Daily   Or  . thiamine  100 mg Intravenous Daily   Continuous Infusions: . sodium chloride Stopped (04/16/19 0336)  . cefTRIAXone (ROCEPHIN)  IV Stopped (04/16/19 0238)  . remdesivir 100 mg in NS 250 mL 100 mg (04/16/19 0921)   PRN  Meds:.acetaminophen, chlorpheniramine-HYDROcodone, guaiFENesin-dextromethorphan, hydrALAZINE, influenza vac split quadrivalent PF, LORazepam **OR** LORazepam, ondansetron **OR** ondansetron (ZOFRAN) IV   Time Spent in minutes  45  The patient is critically ill with multiple organ system failure and requires high complexity decision making for assessment and support, frequent evaluation and titration of therapies, advanced monitoring, review of radiographic studies and interpretation of complex data.   See all Orders from today for further details   Oren Binet M.D on 04/16/2019 at 10:44 AM  To page go to [email or web address] - use universal password  [facility] -  Office  [phone]    Objective:   Vitals:   04/16/19 0600 04/16/19 0700 04/16/19 0728 04/16/19 0828  BP: (!) 160/84 (!) 150/84  (!) 146/97  Pulse: 68   83  Resp: (!) 21 (!) 29  (!) 30  Temp:   97.8 F (36.6 C)   TempSrc:   Oral   SpO2: 94% 93% (!) 88% 91%  Weight:      Height:        Wt Readings from Last 3 Encounters:  04/15/19 95.6 kg  11/12/18 81.6 kg     Intake/Output Summary (Last 24 hours) at 04/16/2019 1044 Last data filed at 04/16/2019 0400 Gross per 24 hour  Intake 1113.1 ml  Output 2010 ml  Net -896.9 ml     Physical Exam Gen Exam:Alert awake-not in any distress HEENT:atraumatic, normocephalic Chest: B/L clear to auscultation anteriorly CVS:S1S2 regular Abdomen:soft non tender, non distended Extremities:no edema Neurology: Non focal Skin: no rash   Data Review:    CBC Recent Labs  Lab 04/27/2019 1640 04/15/19 0900 04/16/19 0750  WBC 8.9 10.0 11.5*  HGB 11.8* 11.6* 11.1*  HCT 40.9 40.6 40.3  PLT 308 375 478*  MCV 71.8* 73.3* 73.9*  MCH 20.7* 20.9* 20.4*  MCHC 28.9* 28.6* 27.5*  RDW 19.7* 20.1* 20.1*  LYMPHSABS 0.7 0.8 1.0  MONOABS 0.2 0.3 0.6  EOSABS 0.0 0.0 0.0  BASOSABS 0.0 0.0 0.0    Chemistries  Recent Labs  Lab 04/23/2019 1640 04/15/19 0900 04/16/19 0750   NA 139 141 145  K 3.6 4.0 3.9  CL 101 105 103  CO2 26 26 33*  GLUCOSE 117* 137* 121*  BUN 12 16 24*  CREATININE 0.91 0.70 0.81  CALCIUM 8.2* 8.4* 8.6*  MG  --  2.2  2.1  --   AST 68* 45* 34  ALT 51* 40 34  ALKPHOS 100 94 94  BILITOT 0.5 0.4 0.8   ------------------------------------------------------------------------------------------------------------------ Recent Labs    04/27/2019 1640  TRIG 79    No results found for: HGBA1C ------------------------------------------------------------------------------------------------------------------ No results for input(s): TSH, T4TOTAL, T3FREE, THYROIDAB in the last 72 hours.  Invalid input(s): FREET3 ------------------------------------------------------------------------------------------------------------------ Recent Labs    04/15/19 0900 04/16/19 0750  FERRITIN 54 46    Coagulation profile No results for input(s): INR, PROTIME in the last 168 hours.  Recent Labs    04/05/2019 1640 04/16/19 0750  DDIMER 0.99* 1.48*    Cardiac Enzymes No  results for input(s): CKMB, TROPONINI, MYOGLOBIN in the last 168 hours.  Invalid input(s): CK ------------------------------------------------------------------------------------------------------------------ No results found for: BNP  Micro Results Recent Results (from the past 240 hour(s))  SARS CORONAVIRUS 2 (TAT 6-24 HRS) Nasopharyngeal Nasopharyngeal Swab     Status: Abnormal   Collection Time: 04/29/2019  4:36 PM   Specimen: Nasopharyngeal Swab  Result Value Ref Range Status   SARS Coronavirus 2 POSITIVE (A) NEGATIVE Final    Comment: RESULT CALLED TO, READ BACK BY AND VERIFIED WITH: Pattricia Boss, RN AT 2248 ON 04/15/2019 BY SAINVILUS S (NOTE) SARS-CoV-2 target nucleic acids are DETECTED. The SARS-CoV-2 RNA is generally detectable in upper and lower respiratory specimens during the acute phase of infection. Positive results are indicative of active infection with  SARS-CoV-2. Clinical  correlation with patient history and other diagnostic information is necessary to determine patient infection status. Positive results do  not rule out bacterial infection or co-infection with other viruses. The expected result is Negative. Fact Sheet for Patients: HairSlick.no Fact Sheet for Healthcare Providers: quierodirigir.com This test is not yet approved or cleared by the [place] FDA and  has been authorized for detection and/or diagnosis of SARS-CoV-2 by FDA under an Emergency Use Authorization (EUA). This EUA will remain  in effect (meaning this test can  be used) for the duration of the COVID-19 declaration under Section 564(b)(1) of the Act, 21 U.S.C. section 360bbb-3(b)(1), unless the authorization is terminated or revoked sooner. Performed at [facility], [address], [place], [place] [address]   Blood Culture (routine x 2)     Status: None (Preliminary result)   Collection Time: 04/05/2019  4:40 PM   Specimen: BLOOD  Result Value Ref Range Status   Specimen Description BLOOD RIGHT ANTECUBITAL  Final   Special Requests   Final    BOTTLES DRAWN AEROBIC AND ANAEROBIC Blood Culture adequate volume   Culture   Final    NO GROWTH 2 DAYS Performed at [facility], [address] [place], [place] [address]    Report Status PENDING  Incomplete  Blood Culture (routine x 2)     Status: None (Preliminary result)   Collection Time: 04/21/2019  4:48 PM   Specimen: BLOOD LEFT HAND  Result Value Ref Range Status   Specimen Description BLOOD LEFT HAND  Final   Special Requests   Final    BOTTLES DRAWN AEROBIC AND ANAEROBIC Blood Culture results may not be optimal due to an inadequate volume of blood received in culture bottles   Culture   Final    NO GROWTH 2 DAYS Performed at [facility], [address] [place], [place] [address]    Report Status PENDING  Incomplete  MRSA  PCR Screening     Status: None   Collection Time: 04/15/19  5:40 AM   Specimen: Nasopharyngeal  Result Value Ref Range Status   MRSA by PCR NEGATIVE NEGATIVE Final    Comment:        The GeneXpert MRSA Assay (FDA approved for NASAL specimens only), is one component of a comprehensive MRSA colonization surveillance program. It is not intended to diagnose MRSA infection nor to guide or monitor treatment for MRSA infections. Performed at [facility], [address], [place], [place] [address]     Radiology Reports Dg Chest Port 1 View  Result Date: 04/19/2019 CLINICAL DATA:  Shortness of breath, fever, weakness EXAM: PORTABLE CHEST 1 VIEW COMPARISON:  11/11/2018 FINDINGS: Cardiomegaly.  Extensive bilateral interstitial and heterogeneous pulmonary opacity. The visualized skeletal structures are unremarkable. IMPRESSION: 1. Extensive bilateral interstitial and heterogeneous pulmonary opacity, consistent with multifocal infection. 2.  Cardiomegaly. Electronically Signed   By: Lauralyn PrimesAlex  Bibbey M.D.   On: 10-09-2018 17:54

## 2019-04-16 NOTE — Progress Notes (Signed)
Critical ABG values given to Dr. Yacoub. 

## 2019-04-16 NOTE — Consult Note (Addendum)
NAMECassius Richard, MRN:  [id number], DOB:  04-Apr-1961, LOS: 2 ADMISSION DATE:  04/03/2019, CONSULTATION DATE:  04/16/2019 REFERRING MD:  [facility] - Ghimire, CHIEF COMPLAINT:  Acute hypoxemic respiratory failure   Brief History   58 year old alcoholic male with COVID 19 pneumonia induced acute hypoxemic respiratory failure and alcohol withdrawal who became obtunded and hypoxemic and was intubated emergently.  PCCM consulted for sedation and vent management.  PCCM will assume care.  History of present illness   58 year old alcoholic male with COVID 19 pneumonia induced acute hypoxemic respiratory failure and alcohol withdrawal who became obtunded and hypoxemic and was intubated emergently.  PCCM consulted for sedation and vent management.  PCCM will assume care.  Past Medical History  Etoh abuse  Significant Hospital Events   11/17 intubation for respiratory failure  Consults:  PCCM  Procedures:  ETT 11/17>>>  Significant Diagnostic Tests:  CXR that I reviewed myself with diffuse pulmonary infiltrate  Micro Data:  COVID 19 positive Blood cx 11/15>>>NTD  Antimicrobials:  Remdisivir 11/15>>>  Rocephin 11/15>>> Zithromax 11/15>>>  Interim history/subjective:  Acute respiratory failure obtunded needed emergent intubation  Objective   Blood pressure (!) 176/85, pulse 87, temperature 97.8 F (36.6 C), temperature source Oral, resp. rate (!) 25, height 5\' 1"  (1.549 m), weight 95.6 kg, SpO2 100 %.    Vent Mode: PRVC FiO2 (%):  [65 %-100 %] 100 % Set Rate:  [25 bmp] 25 bmp Vt Set:  [420 mL] 420 mL PEEP:  [18 cmH20] 18 cmH20 Plateau Pressure:  [33 cmH20] 33 cmH20   Intake/Output Summary (Last 24 hours) at 04/16/2019 1339 Last data filed at 04/16/2019 1200 Gross per 24 hour  Intake 1093.67 ml  Output 735 ml  Net 358.67 ml   Filed Weights   04/15/19 0550  Weight: 95.6 kg    Examination: General: Acutely ill appearing adult male, obtunded in obvious respiratory failure  HENT: [place]/AT, PERRL, EOM-I and MMM, ETT in place Lungs: Coarse BS diffusely Cardiovascular: Regular, tachy, Nl S1/S2 and -M/R/G Abdomen: Soft, NT, ND and +BS Extremities: -edema and -tenderness Neuro: Obtunded but withdraws all ext to pain Skin: Intact  Resolved Hospital Problem list   N/A  Assessment & Plan:  58 year old male with COVID-19 and alcohol withdrawal now in acute hypoxemic respiratory failure with diffuse infiltrate.  VDRF: - ARDS protocol - Titrate O2 for sat of 85-88% - Increase PEEP to 18 and titrate along with FiO2 as able - ABG now - Adjust vent for ABG - Decadron - Remdesivir  CAP: - Rocephin - Zithromax - F/u on culture  ETOH withdrawal: - Precedex - Thiamine - Folate - MVI - CIWA protocol  FEN: - Place OGT - Start TF  PCCM will assume care  Labs   CBC: Recent Labs  Lab 04/05/2019 1640 04/15/19 0900 04/16/19 0750  WBC 8.9 10.0 11.5*  NEUTROABS 8.0* 8.9* 9.9*  HGB 11.8* 11.6* 11.1*  HCT 40.9 40.6 40.3  MCV 71.8* 73.3* 73.9*  PLT 308 375 478*    Basic Metabolic Panel: Recent Labs  Lab 04/22/2019 1640 04/15/19 0900 04/16/19 0750  NA 139 141 145  K 3.6 4.0 3.9  CL 101 105 103  CO2 26 26 33*  GLUCOSE 117* 137* 121*  BUN 12 16 24*  CREATININE 0.91 0.70 0.81  CALCIUM 8.2* 8.4* 8.6*  MG  --  2.2  2.1  --   PHOS  --  2.9  2.8  --  GFR: Estimated Creatinine Clearance: 111 mL/min (by C-G formula based on SCr of 0.81 mg/dL). Recent Labs  Lab 04/29/2019 1640 04/15/19 0900 04/16/19 0750  PROCALCITON 1.50  --   --   WBC 8.9 10.0 11.5*  LATICACIDVEN 1.5  --   --     Liver Function Tests: Recent Labs  Lab 04/23/2019 1640 04/15/19 0900 04/16/19 0750  AST 68* 45* 34  ALT 51* 40 34  ALKPHOS 100 94 94  BILITOT 0.5 0.4 0.8  PROT 7.5 7.4 7.4  ALBUMIN 3.2* 2.9* 3.1*   No results for input(s): LIPASE, AMYLASE in the last 168 hours. No results for input(s): AMMONIA in the last 168 hours.  ABG No results found for: PHART,  PCO2ART, PO2ART, HCO3, TCO2, ACIDBASEDEF, O2SAT   Coagulation Profile: No results for input(s): INR, PROTIME in the last 168 hours.  Cardiac Enzymes: No results for input(s): CKTOTAL, CKMB, CKMBINDEX, TROPONINI in the last 168 hours.  HbA1C: No results found for: HGBA1C  CBG: No results for input(s): GLUCAP in the last 168 hours.  Review of Systems:   Unattainable   Past Medical History  He,  has a past medical history of Hypertension.   Surgical History   History reviewed. No pertinent surgical history.   Social History   reports that he has never smoked. He has never used smokeless tobacco. He reports current alcohol use. He reports that he does not use drugs.   Family History   His family history is not on file.   Allergies No Known Allergies   Home Medications  Prior to Admission medications   Medication Sig Start Date End Date Taking? Authorizing Provider  chlordiazePOXIDE (LIBRIUM) 10 MG capsule twice a day today one tomorrow then d/c Patient taking differently: Take 10 mg by mouth daily as needed for withdrawal.  11/16/18  Yes Johnn Hai, MD  lurasidone (LATUDA) 80 MG TABS tablet Take 80 mg by mouth daily with breakfast.   Yes [provider]  metoprolol succinate (TOPROL-XL) 100 MG 24 hr tablet Take 1 tablet (100 mg total) by mouth daily. Take with or immediately following a meal. 11/14/18  Yes Johnn Hai, MD  mirtazapine (REMERON) 15 MG tablet Take 1 tablet (15 mg total) by mouth at bedtime. 11/14/18 11/14/19 Yes Johnn Hai, MD  PARoxetine (PAXIL) 20 MG tablet Take 1 tablet (20 mg total) by mouth daily. 11/14/18  Yes Johnn Hai, MD    The patient is critically ill with multiple organ systems failure and requires high complexity decision making for assessment and support, frequent evaluation and titration of therapies, application of advanced monitoring technologies and extensive interpretation of multiple databases.   Critical Care Time devoted to  patient care services described in this note is  45  Minutes. This time reflects time of care of this signee Dr Jennet Maduro. This critical care time does not reflect procedure time, or teaching time or supervisory time of PA/NP/Med student/Med Resident etc but could involve care discussion time.  Rush Farmer, M.D. [facility] Pulmonary/Critical Care Medicine.

## 2019-04-17 ENCOUNTER — Inpatient Hospital Stay (HOSPITAL_COMMUNITY): Payer: HRSA Program

## 2019-04-17 DIAGNOSIS — I10 Essential (primary) hypertension: Secondary | ICD-10-CM

## 2019-04-17 LAB — COMPREHENSIVE METABOLIC PANEL
ALT: 26 U/L (ref 0–44)
AST: 29 U/L (ref 15–41)
Albumin: 2.8 g/dL — ABNORMAL LOW (ref 3.5–5.0)
Alkaline Phosphatase: 82 U/L (ref 38–126)
Anion gap: 12 (ref 5–15)
BUN: 60 mg/dL — ABNORMAL HIGH (ref 6–20)
CO2: 30 mmol/L (ref 22–32)
Calcium: 8.5 mg/dL — ABNORMAL LOW (ref 8.9–10.3)
Chloride: 101 mmol/L (ref 98–111)
Creatinine, Ser: 1.85 mg/dL — ABNORMAL HIGH (ref 0.61–1.24)
GFR calc Af Amer: 49 mL/min — ABNORMAL LOW (ref >60)
GFR calc non Af Amer: 42 mL/min — ABNORMAL LOW (ref >60)
Glucose, Bld: 120 mg/dL — ABNORMAL HIGH (ref 70–99)
Potassium: 4.1 mmol/L (ref 3.5–5.1)
Sodium: 143 mmol/L (ref 135–145)
Total Bilirubin: 0.4 mg/dL (ref 0.3–1.2)
Total Protein: 6.9 g/dL (ref 6.5–8.1)

## 2019-04-17 LAB — CBC WITH DIFFERENTIAL/PLATELET
Abs Immature Granulocytes: 0.06 10*3/uL (ref 0.00–0.07)
Basophils Absolute: 0 10*3/uL (ref 0.0–0.1)
Basophils Relative: 0 %
Eosinophils Absolute: 0 10*3/uL (ref 0.0–0.5)
Eosinophils Relative: 0 %
HCT: 37.5 % — ABNORMAL LOW (ref 39.0–52.0)
Hemoglobin: 10.7 g/dL — ABNORMAL LOW (ref 13.0–17.0)
Immature Granulocytes: 1 %
Lymphocytes Relative: 11 %
Lymphs Abs: 1.1 10*3/uL (ref 0.7–4.0)
MCH: 20.8 pg — ABNORMAL LOW (ref 26.0–34.0)
MCHC: 28.5 g/dL — ABNORMAL LOW (ref 30.0–36.0)
MCV: 73 fL — ABNORMAL LOW (ref 80.0–100.0)
Monocytes Absolute: 1 10*3/uL (ref 0.1–1.0)
Monocytes Relative: 9 %
Neutro Abs: 8.3 10*3/uL — ABNORMAL HIGH (ref 1.7–7.7)
Neutrophils Relative %: 79 %
Platelets: 430 10*3/uL — ABNORMAL HIGH (ref 150–400)
RBC: 5.14 MIL/uL (ref 4.22–5.81)
RDW: 19.3 % — ABNORMAL HIGH (ref 11.5–15.5)
WBC: 10.5 10*3/uL (ref 4.0–10.5)
nRBC: 0.2 % (ref 0.0–0.2)

## 2019-04-17 LAB — POCT I-STAT 7, (LYTES, BLD GAS, ICA,H+H)
Acid-Base Excess: 9 mmol/L — ABNORMAL HIGH (ref 0.0–2.0)
Bicarbonate: 34.6 mmol/L — ABNORMAL HIGH (ref 20.0–28.0)
Calcium, Ion: 1.16 mmol/L (ref 1.15–1.40)
HCT: 34 % — ABNORMAL LOW (ref 39.0–52.0)
Hemoglobin: 11.6 g/dL — ABNORMAL LOW (ref 13.0–17.0)
O2 Saturation: 94 %
Patient temperature: 37.4
Potassium: 3.8 mmol/L (ref 3.5–5.1)
Sodium: 144 mmol/L (ref 135–145)
TCO2: 36 mmol/L — ABNORMAL HIGH (ref 22–32)
pCO2 arterial: 54.8 mmHg — ABNORMAL HIGH (ref 32.0–48.0)
pH, Arterial: 7.41 (ref 7.350–7.450)
pO2, Arterial: 74 mmHg — ABNORMAL LOW (ref 83.0–108.0)

## 2019-04-17 LAB — MAGNESIUM: Magnesium: 2.4 mg/dL (ref 1.7–2.4)

## 2019-04-17 LAB — GLUCOSE, CAPILLARY
Glucose-Capillary: 122 mg/dL — ABNORMAL HIGH (ref 70–99)
Glucose-Capillary: 127 mg/dL — ABNORMAL HIGH (ref 70–99)
Glucose-Capillary: 140 mg/dL — ABNORMAL HIGH (ref 70–99)
Glucose-Capillary: 140 mg/dL — ABNORMAL HIGH (ref 70–99)
Glucose-Capillary: 141 mg/dL — ABNORMAL HIGH (ref 70–99)
Glucose-Capillary: 151 mg/dL — ABNORMAL HIGH (ref 70–99)
Glucose-Capillary: 157 mg/dL — ABNORMAL HIGH (ref 70–99)

## 2019-04-17 LAB — C-REACTIVE PROTEIN: CRP: 8.8 mg/dL — ABNORMAL HIGH (ref <1.0)

## 2019-04-17 LAB — D-DIMER, QUANTITATIVE: D-Dimer, Quant: 1.1 ug/mL-FEU — ABNORMAL HIGH (ref 0.00–0.50)

## 2019-04-17 LAB — PHOSPHORUS: Phosphorus: 3.2 mg/dL (ref 2.5–4.6)

## 2019-04-17 LAB — FERRITIN: Ferritin: 35 ng/mL (ref 24–336)

## 2019-04-17 MED ORDER — VITAMIN B-1 100 MG PO TABS
100.0000 mg | ORAL_TABLET | Freq: Every day | ORAL | Status: DC
  Administered 2019-04-18 – 2019-04-22 (×5): 100 mg
  Filled 2019-04-17 (×5): qty 1

## 2019-04-17 MED ORDER — LURASIDONE HCL 40 MG PO TABS
80.0000 mg | ORAL_TABLET | Freq: Every day | ORAL | Status: DC
  Administered 2019-04-18 – 2019-04-22 (×5): 80 mg
  Filled 2019-04-17 (×7): qty 2

## 2019-04-17 MED ORDER — ADULT MULTIVITAMIN LIQUID CH
15.0000 mL | Freq: Every day | ORAL | Status: DC
  Administered 2019-04-18 – 2019-04-22 (×5): 15 mL
  Filled 2019-04-17 (×7): qty 15

## 2019-04-17 MED ORDER — THIAMINE HCL 100 MG/ML IJ SOLN
100.0000 mg | Freq: Every day | INTRAMUSCULAR | Status: DC
  Filled 2019-04-17: qty 2

## 2019-04-17 MED ORDER — FAMOTIDINE 40 MG/5ML PO SUSR
20.0000 mg | Freq: Every day | ORAL | Status: DC
  Administered 2019-04-18 – 2019-04-22 (×5): 20 mg
  Filled 2019-04-17 (×5): qty 2.5

## 2019-04-17 MED ORDER — PAROXETINE HCL 20 MG PO TABS
20.0000 mg | ORAL_TABLET | Freq: Every day | ORAL | Status: DC
  Administered 2019-04-18 – 2019-04-22 (×5): 20 mg
  Filled 2019-04-17 (×7): qty 1

## 2019-04-17 MED ORDER — MIRTAZAPINE 15 MG PO TABS
15.0000 mg | ORAL_TABLET | Freq: Every day | ORAL | Status: DC
  Administered 2019-04-17 – 2019-04-21 (×5): 15 mg
  Filled 2019-04-17 (×6): qty 1

## 2019-04-17 MED ORDER — FOLIC ACID 1 MG PO TABS
1.0000 mg | ORAL_TABLET | Freq: Every day | ORAL | Status: DC
  Administered 2019-04-18 – 2019-04-22 (×5): 1 mg
  Filled 2019-04-17 (×5): qty 1

## 2019-04-17 MED ORDER — AZITHROMYCIN 200 MG/5ML PO SUSR
500.0000 mg | Freq: Every day | ORAL | Status: AC
  Administered 2019-04-18 – 2019-04-19 (×2): 500 mg
  Filled 2019-04-17 (×2): qty 12.5

## 2019-04-17 MED ORDER — METOPROLOL TARTRATE 25 MG/10 ML ORAL SUSPENSION
50.0000 mg | Freq: Two times a day (BID) | ORAL | Status: DC
  Administered 2019-04-17 – 2019-04-20 (×7): 50 mg
  Filled 2019-04-17 (×7): qty 20

## 2019-04-17 NOTE — Progress Notes (Addendum)
NAMEDaeshawn Richard, MRN:  [id number], DOB:  August 11, 1960, LOS: 3 ADMISSION DATE:  04/09/2019, CONSULTATION DATE:  04/16/2019 REFERRING MD:  TRH - Ghimire, CHIEF COMPLAINT:  Acute hypoxemic respiratory failure   Brief History   58 year old alcoholic male with COVID 19 pneumonia induced acute hypoxemic respiratory failure and alcohol withdrawal who became obtunded and hypoxemic and was intubated emergently.  PCCM consulted for sedation and vent management.  PCCM will assume care.  History of present illness   58 year old alcoholic male with COVID 19 pneumonia induced acute hypoxemic respiratory failure and alcohol withdrawal who became obtunded and hypoxemic and was intubated emergently.  PCCM consulted for sedation and vent management.  PCCM will assume care.  Past Medical History  Etoh abuse  Significant Hospital Events   11/17 intubation for respiratory failure  Consults:  PCCM  Procedures:  ETT 11/17>>>  Significant Diagnostic Tests:  CXR that I reviewed myself with diffuse pulmonary infiltrate  Micro Data:  COVID 19 positive Blood cx 11/15>>>NTD  Antimicrobials:  Remdisivir 11/15>>>  Rocephin 11/15>>> Zithromax 11/15>>>  Interim history/subjective:  Intubated yesterday for AMS and hypoxemic respiratory failure  Objective   Blood pressure 119/68, pulse 64, temperature 100.3 F (37.9 C), temperature source Axillary, resp. rate (!) 30, height 5\' 8"  (1.727 m), weight 95.6 kg, SpO2 94 %.    Vent Mode: PRVC FiO2 (%):  [50 %-100 %] 50 % Set Rate:  [25 bmp-30 bmp] 30 bmp Vt Set:  [420 mL] 420 mL PEEP:  [8 ZDG38-75 cmH20] 8 cmH20 Plateau Pressure:  [24 cmH20-33 cmH20] 24 cmH20   Intake/Output Summary (Last 24 hours) at 04/17/2019 0858 Last data filed at 04/17/2019 0700 Gross per 24 hour  Intake 1223.2 ml  Output 1150 ml  Net 73.2 ml   Filed Weights   04/15/19 0550  Weight: 95.6 kg    Examination: General: Acutely ill appearing male, NAD, sedate HENT: [facility],  PERRL, EOM-spontaneous and MMM Lungs: Diminished diffusely Cardiovascular: RRR, Nl S1/S2 and -M/R/G Abdomen: Soft, NT, ND and +BS Extremities: -edema and -tenderness Neuro: Sedate, withdraws ext to pain Skin: Intact  I reviewed CXR myself, ETT ok and infiltrate noted  Discussed with RT and RN  Resolved Hospital Problem list   N/A  Assessment & Plan:  58 year old male with COVID-19 and alcohol withdrawal now in acute hypoxemic respiratory failure with diffuse infiltrate.  VDRF: - ARDS protocol - Titrate O2 for sat of 85-88% - Goal today 50/8, hold off weaning given mental status - Adjust vent for ABG - Hold off diureses today - Decadron stop date in place - Remdesivir stop date in place  CAP: - Rocephin - Zithromax - F/u on culture  ETOH withdrawal: - Precedex - Thiamine - Folate - MVI - CIWA protocol - Versed drip started overnight, will continue for now  FEN: - OGT - Continue TF  Labs   CBC: Recent Labs  Lab 04/05/2019 1640 04/15/19 0900 04/16/19 0750 04/16/19 1504 04/17/19 0542 04/17/19 0645  WBC 8.9 10.0 11.5*  --  10.5  --   NEUTROABS 8.0* 8.9* 9.9*  --  8.3*  --   HGB 11.8* 11.6* 11.1* 13.3 10.7* 11.6*  HCT 40.9 40.6 40.3 39.0 37.5* 34.0*  MCV 71.8* 73.3* 73.9*  --  73.0*  --   PLT 308 375 478*  --  430*  --     Basic Metabolic Panel: Recent Labs  Lab 04/20/2019 1640 04/15/19 0900 04/16/19 0750 04/16/19 1504 04/17/19 0542 04/17/19  0645  NA 139 141 145 143 143 144  K 3.6 4.0 3.9 4.0 4.1 3.8  CL 101 105 103  --  101  --   CO2 26 26 33*  --  30  --   GLUCOSE 117* 137* 121*  --  120*  --   BUN 12 16 24*  --  60*  --   CREATININE 0.91 0.70 0.81  --  1.85*  --   CALCIUM 8.2* 8.4* 8.6*  --  8.5*  --   MG  --  2.2  2.1  --   --  2.4  --   PHOS  --  2.9  2.8  --   --  3.2  --    GFR: Estimated Creatinine Clearance: 55.4 mL/min (A) (by C-G formula based on SCr of 1.85 mg/dL (H)). Recent Labs  Lab 04/05/2019 1640 04/15/19 0900 04/16/19  0750 04/17/19 0542  PROCALCITON 1.50  --   --   --   WBC 8.9 10.0 11.5* 10.5  LATICACIDVEN 1.5  --   --   --     Liver Function Tests: Recent Labs  Lab 04/12/2019 1640 04/15/19 0900 04/16/19 0750 04/17/19 0542  AST 68* 45* 34 29  ALT 51* 40 34 26  ALKPHOS 100 94 94 82  BILITOT 0.5 0.4 0.8 0.4  PROT 7.5 7.4 7.4 6.9  ALBUMIN 3.2* 2.9* 3.1* 2.8*   No results for input(s): LIPASE, AMYLASE in the last 168 hours. No results for input(s): AMMONIA in the last 168 hours.  ABG    Component Value Date/Time   PHART 7.410 04/17/2019 0645   PCO2ART 54.8 (H) 04/17/2019 0645   PO2ART 74.0 (L) 04/17/2019 0645   HCO3 34.6 (H) 04/17/2019 0645   TCO2 36 (H) 04/17/2019 0645   O2SAT 94.0 04/17/2019 0645     Coagulation Profile: No results for input(s): INR, PROTIME in the last 168 hours.  Cardiac Enzymes: No results for input(s): CKTOTAL, CKMB, CKMBINDEX, TROPONINI in the last 168 hours.  HbA1C: No results found for: HGBA1C  CBG: Recent Labs  Lab 04/16/19 1937 04/17/19 0015 04/17/19 0515  GLUCAP 123* 151* 140*   The patient is critically ill with multiple organ systems failure and requires high complexity decision making for assessment and support, frequent evaluation and titration of therapies, application of advanced monitoring technologies and extensive interpretation of multiple databases.   Critical Care Time devoted to patient care services described in this note is  34  Minutes. This time reflects time of care of this signee Dr Koren Bound. This critical care time does not reflect procedure time, or teaching time or supervisory time of PA/NP/Med student/Med Resident etc but could involve care discussion time.  Alyson Reedy, M.D. [facility] Pulmonary/Critical Care Medicine.

## 2019-04-17 NOTE — Progress Notes (Signed)
Reached out to [place], patient's wife, with the phone number listed in [place]. No answer at this time to provide update on patient.

## 2019-04-17 NOTE — Progress Notes (Signed)
Called [facility] Richard and spoke with Ivin Booty, RN and made her aware that bladder scan result greater than 450 and no void. RN to speak with MD about in and out cath.

## 2019-04-17 NOTE — Progress Notes (Signed)
Patient noted to brady with coughing and gagging against ETT.

## 2019-04-17 NOTE — Progress Notes (Addendum)
eLink Physician-Brief Progress Note Patient Name: Tri Chittick DOB: 1961/05/10 MRN: [id number]   Date of Service  04/17/2019  HPI/Events of Note  Urinary retention  eICU Interventions  In/out urinary bladder catheterization x 1        Talan Gildner U [facility]amp [place] 04/17/2019, 5:40 AM

## 2019-04-18 ENCOUNTER — Inpatient Hospital Stay (HOSPITAL_COMMUNITY): Payer: HRSA Program

## 2019-04-18 LAB — CBC WITH DIFFERENTIAL/PLATELET
Abs Immature Granulocytes: 0.12 10*3/uL — ABNORMAL HIGH (ref 0.00–0.07)
Basophils Absolute: 0 10*3/uL (ref 0.0–0.1)
Basophils Relative: 0 %
Eosinophils Absolute: 0 10*3/uL (ref 0.0–0.5)
Eosinophils Relative: 0 %
HCT: 39.2 % (ref 39.0–52.0)
Hemoglobin: 10.9 g/dL — ABNORMAL LOW (ref 13.0–17.0)
Immature Granulocytes: 1 %
Lymphocytes Relative: 9 %
Lymphs Abs: 1 10*3/uL (ref 0.7–4.0)
MCH: 20.6 pg — ABNORMAL LOW (ref 26.0–34.0)
MCHC: 27.8 g/dL — ABNORMAL LOW (ref 30.0–36.0)
MCV: 74.1 fL — ABNORMAL LOW (ref 80.0–100.0)
Monocytes Absolute: 1.1 10*3/uL — ABNORMAL HIGH (ref 0.1–1.0)
Monocytes Relative: 9 %
Neutro Abs: 9.3 10*3/uL — ABNORMAL HIGH (ref 1.7–7.7)
Neutrophils Relative %: 81 %
Platelets: 503 10*3/uL — ABNORMAL HIGH (ref 150–400)
RBC: 5.29 MIL/uL (ref 4.22–5.81)
RDW: 19.9 % — ABNORMAL HIGH (ref 11.5–15.5)
WBC: 11.5 10*3/uL — ABNORMAL HIGH (ref 4.0–10.5)
nRBC: 0.2 % (ref 0.0–0.2)

## 2019-04-18 LAB — MAGNESIUM: Magnesium: 2.5 mg/dL — ABNORMAL HIGH (ref 1.7–2.4)

## 2019-04-18 LAB — COMPREHENSIVE METABOLIC PANEL
ALT: 27 U/L (ref 0–44)
AST: 38 U/L (ref 15–41)
Albumin: 2.7 g/dL — ABNORMAL LOW (ref 3.5–5.0)
Alkaline Phosphatase: 91 U/L (ref 38–126)
Anion gap: 10 (ref 5–15)
BUN: 45 mg/dL — ABNORMAL HIGH (ref 6–20)
CO2: 33 mmol/L — ABNORMAL HIGH (ref 22–32)
Calcium: 8.6 mg/dL — ABNORMAL LOW (ref 8.9–10.3)
Chloride: 104 mmol/L (ref 98–111)
Creatinine, Ser: 0.86 mg/dL (ref 0.61–1.24)
GFR calc Af Amer: >60 mL/min (ref >60)
GFR calc non Af Amer: >60 mL/min (ref >60)
Glucose, Bld: 148 mg/dL — ABNORMAL HIGH (ref 70–99)
Potassium: 4.2 mmol/L (ref 3.5–5.1)
Sodium: 147 mmol/L — ABNORMAL HIGH (ref 135–145)
Total Bilirubin: 0.4 mg/dL (ref 0.3–1.2)
Total Protein: 7.2 g/dL (ref 6.5–8.1)

## 2019-04-18 LAB — POCT I-STAT 7, (LYTES, BLD GAS, ICA,H+H)
Acid-Base Excess: 10 mmol/L — ABNORMAL HIGH (ref 0.0–2.0)
Bicarbonate: 36.5 mmol/L — ABNORMAL HIGH (ref 20.0–28.0)
Calcium, Ion: 1.2 mmol/L (ref 1.15–1.40)
HCT: 36 % — ABNORMAL LOW (ref 39.0–52.0)
Hemoglobin: 12.2 g/dL — ABNORMAL LOW (ref 13.0–17.0)
O2 Saturation: 89 %
Patient temperature: 37.7
Potassium: 4 mmol/L (ref 3.5–5.1)
Sodium: 149 mmol/L — ABNORMAL HIGH (ref 135–145)
TCO2: 38 mmol/L — ABNORMAL HIGH (ref 22–32)
pCO2 arterial: 62.2 mmHg — ABNORMAL HIGH (ref 32.0–48.0)
pH, Arterial: 7.38 (ref 7.350–7.450)
pO2, Arterial: 62 mmHg — ABNORMAL LOW (ref 83.0–108.0)

## 2019-04-18 LAB — C-REACTIVE PROTEIN: CRP: 7.6 mg/dL — ABNORMAL HIGH (ref <1.0)

## 2019-04-18 LAB — GLUCOSE, CAPILLARY: Glucose-Capillary: 137 mg/dL — ABNORMAL HIGH (ref 70–99)

## 2019-04-18 LAB — FERRITIN: Ferritin: 36 ng/mL (ref 24–336)

## 2019-04-18 LAB — D-DIMER, QUANTITATIVE: D-Dimer, Quant: 1.68 ug/mL-FEU — ABNORMAL HIGH (ref 0.00–0.50)

## 2019-04-18 LAB — PHOSPHORUS: Phosphorus: 3.5 mg/dL (ref 2.5–4.6)

## 2019-04-18 NOTE — Progress Notes (Addendum)
NAMEJayvier Richard, MRN:  [id number], DOB:  03/15/1961, LOS: 4 ADMISSION DATE:  04/22/2019, CONSULTATION DATE:  04/16/2019 REFERRING MD:  TRH - Ghimire, CHIEF COMPLAINT:  Acute hypoxemic respiratory failure   Brief History   58 year old alcoholic male with COVID 19 pneumonia induced acute hypoxemic respiratory failure and alcohol withdrawal who became obtunded and hypoxemic and was intubated emergently.  PCCM consulted for sedation and vent management.  Past Medical History  Etoh abuse  Significant Hospital Events   11/17 intubation for respiratory failure  Consults:  PCCM  Procedures:  ETT 11/17>>>  Significant Diagnostic Tests:  CXR that I reviewed myself with diffuse pulmonary infiltrate  Micro Data:  COVID 19 positive Blood cx 11/15>>>NTD  Antimicrobials:  Remdisivir 11/15>>>  Rocephin 11/15>>> Zithromax 11/15>>>  Interim history/subjective:  Continues on the vent.  No acute events.  Objective   Blood pressure (!) 153/85, pulse 79, temperature (!) 100.6 F (38.1 C), resp. rate (!) 31, height 5\' 8"  (1.727 m), weight 94 kg, SpO2 91 %.    Vent Mode: PRVC FiO2 (%):  [45 %-70 %] 45 % Set Rate:  [30 bmp] 30 bmp Vt Set:  [420 mL] 420 mL PEEP:  [8 cmH20-10 cmH20] 10 cmH20 Plateau Pressure:  [16 cmH20-30 cmH20] 30 cmH20   Intake/Output Summary (Last 24 hours) at 04/18/2019 0902 Last data filed at 04/18/2019 0600 Gross per 24 hour  Intake 2013.19 ml  Output 1250 ml  Net 763.19 ml   Filed Weights   04/15/19 0550 04/18/19 0333  Weight: 95.6 kg 94 kg    Examination: Gen:      No acute distress HEENT:  EOMI, sclera anicteric Neck:     No masses; no thyromegaly, ETT Lungs:    Clear to auscultation bilaterally; normal respiratory effort CV:         Regular rate and rhythm; no murmurs Abd:      + bowel sounds; soft, non-tender; no palpable masses, no distension Ext:    No edema; adequate peripheral perfusion Skin:      Warm and dry; no rash Neuro: [facility] Problem list   N/A  Assessment & Plan:  58 year old male with COVID-19 and alcohol withdrawal now in acute hypoxemic respiratory failure with diffuse infiltrate.  VDRF: Continue ARDSnet protocol Wean down PEEP/FiO2 Pressure support weans when mental status improves Continue Decadron, remdesivir.  CAP: Continue ceftriaxone, Zithromax  ETOH withdrawal: On Precedex, Versed drip.  Will wean Versed first Continue thiamine, folate, MVI  FEN: Continue tube feeds.  Labs   CBC: Recent Labs  Lab 04/02/2019 1640 04/15/19 0900 04/16/19 0750 04/16/19 1504 04/17/19 0542 04/17/19 0645 04/18/19 0327 04/18/19 0427  WBC 8.9 10.0 11.5*  --  10.5  --   --  11.5*  NEUTROABS 8.0* 8.9* 9.9*  --  8.3*  --   --  9.3*  HGB 11.8* 11.6* 11.1* 13.3 10.7* 11.6* 12.2* 10.9*  HCT 40.9 40.6 40.3 39.0 37.5* 34.0* 36.0* 39.2  MCV 71.8* 73.3* 73.9*  --  73.0*  --   --  74.1*  PLT 308 375 478*  --  430*  --   --  503*    Basic Metabolic Panel: Recent Labs  Lab 04/06/2019 1640 04/15/19 0900 04/16/19 0750 04/16/19 1504 04/17/19 0542 04/17/19 0645 04/18/19 0327 04/18/19 0427  NA 139 141 145 143 143 144 149* 147*  K 3.6 4.0 3.9 4.0 4.1 3.8 4.0 4.2  CL 101 105 103  --  101  --   --  104  CO2 26 26 33*  --  30  --   --  33*  GLUCOSE 117* 137* 121*  --  120*  --   --  148*  BUN 12 16 24*  --  60*  --   --  45*  CREATININE 0.91 0.70 0.81  --  1.85*  --   --  0.86  CALCIUM 8.2* 8.4* 8.6*  --  8.5*  --   --  8.6*  MG  --  2.2  2.1  --   --  2.4  --   --  2.5*  PHOS  --  2.9  2.8  --   --  3.2  --   --  3.5   GFR: Estimated Creatinine Clearance: 118.1 mL/min (by C-G formula based on SCr of 0.86 mg/dL). Recent Labs  Lab 04/23/2019 1640 04/15/19 0900 04/16/19 0750 04/17/19 0542 04/18/19 0427  PROCALCITON 1.50  --   --   --   --   WBC 8.9 10.0 11.5* 10.5 11.5*  LATICACIDVEN 1.5  --   --   --   --     Liver Function Tests: Recent Labs  Lab 04/12/2019 1640 04/15/19 0900  04/16/19 0750 04/17/19 0542 04/18/19 0427  AST 68* 45* 34 29 38  ALT 51* 40 34 26 27  ALKPHOS 100 94 94 82 91  BILITOT 0.5 0.4 0.8 0.4 0.4  PROT 7.5 7.4 7.4 6.9 7.2  ALBUMIN 3.2* 2.9* 3.1* 2.8* 2.7*   No results for input(s): LIPASE, AMYLASE in the last 168 hours. No results for input(s): AMMONIA in the last 168 hours.  ABG    Component Value Date/Time   PHART 7.380 04/18/2019 0327   PCO2ART 62.2 (H) 04/18/2019 0327   PO2ART 62.0 (L) 04/18/2019 0327   HCO3 36.5 (H) 04/18/2019 0327   TCO2 38 (H) 04/18/2019 0327   O2SAT 89.0 04/18/2019 0327     Coagulation Profile: No results for input(s): INR, PROTIME in the last 168 hours.  Cardiac Enzymes: No results for input(s): CKTOTAL, CKMB, CKMBINDEX, TROPONINI in the last 168 hours.  HbA1C: No results found for: HGBA1C  CBG: Recent Labs  Lab 04/17/19 1249 04/17/19 1549 04/17/19 1937 04/17/19 2312 04/18/19 0423  GLUCAP 140* 127* 122* 157* 137*   The patient is critically ill with multiple organ system failure and requires high complexity decision making for assessment and support, frequent evaluation and titration of therapies, advanced monitoring, review of radiographic studies and interpretation of complex data.   Critical Care Time devoted to patient care services, exclusive of separately billable procedures, described in this note is 35 minutes.   Marshell Garfinkel MD [place] [facility] Pager [phone] If no answer call [phone] 04/18/2019, 9:04 AM

## 2019-04-18 NOTE — Progress Notes (Signed)
Lavaged pt with 10 cc saline flush per MD order. Pt stable throughout with no complications. VS within normal limits.

## 2019-04-19 LAB — FERRITIN: Ferritin: 38 ng/mL (ref 24–336)

## 2019-04-19 LAB — CBC WITH DIFFERENTIAL/PLATELET
Abs Immature Granulocytes: 0.17 10*3/uL — ABNORMAL HIGH (ref 0.00–0.07)
Basophils Absolute: 0 10*3/uL (ref 0.0–0.1)
Basophils Relative: 0 %
Eosinophils Absolute: 0 10*3/uL (ref 0.0–0.5)
Eosinophils Relative: 0 %
HCT: 37.1 % — ABNORMAL LOW (ref 39.0–52.0)
Hemoglobin: 10.4 g/dL — ABNORMAL LOW (ref 13.0–17.0)
Immature Granulocytes: 2 %
Lymphocytes Relative: 7 %
Lymphs Abs: 0.8 10*3/uL (ref 0.7–4.0)
MCH: 20.5 pg — ABNORMAL LOW (ref 26.0–34.0)
MCHC: 28 g/dL — ABNORMAL LOW (ref 30.0–36.0)
MCV: 73 fL — ABNORMAL LOW (ref 80.0–100.0)
Monocytes Absolute: 1 10*3/uL (ref 0.1–1.0)
Monocytes Relative: 10 %
Neutro Abs: 8.6 10*3/uL — ABNORMAL HIGH (ref 1.7–7.7)
Neutrophils Relative %: 81 %
Platelets: 531 10*3/uL — ABNORMAL HIGH (ref 150–400)
RBC: 5.08 MIL/uL (ref 4.22–5.81)
RDW: 19.7 % — ABNORMAL HIGH (ref 11.5–15.5)
WBC: 10.6 10*3/uL — ABNORMAL HIGH (ref 4.0–10.5)
nRBC: 0 % (ref 0.0–0.2)

## 2019-04-19 LAB — COMPREHENSIVE METABOLIC PANEL
ALT: 28 U/L (ref 0–44)
AST: 36 U/L (ref 15–41)
Albumin: 2.5 g/dL — ABNORMAL LOW (ref 3.5–5.0)
Alkaline Phosphatase: 90 U/L (ref 38–126)
Anion gap: 10 (ref 5–15)
BUN: 40 mg/dL — ABNORMAL HIGH (ref 6–20)
CO2: 31 mmol/L (ref 22–32)
Calcium: 8.6 mg/dL — ABNORMAL LOW (ref 8.9–10.3)
Chloride: 108 mmol/L (ref 98–111)
Creatinine, Ser: 0.77 mg/dL (ref 0.61–1.24)
GFR calc Af Amer: >60 mL/min (ref >60)
GFR calc non Af Amer: >60 mL/min (ref >60)
Glucose, Bld: 153 mg/dL — ABNORMAL HIGH (ref 70–99)
Potassium: 4.3 mmol/L (ref 3.5–5.1)
Sodium: 149 mmol/L — ABNORMAL HIGH (ref 135–145)
Total Bilirubin: 0.7 mg/dL (ref 0.3–1.2)
Total Protein: 6.7 g/dL (ref 6.5–8.1)

## 2019-04-19 LAB — PHOSPHORUS: Phosphorus: 3.2 mg/dL (ref 2.5–4.6)

## 2019-04-19 LAB — CULTURE, BLOOD (ROUTINE X 2)
Culture: NO GROWTH
Culture: NO GROWTH
Special Requests: ADEQUATE

## 2019-04-19 LAB — GLUCOSE, CAPILLARY
Glucose-Capillary: 134 mg/dL — ABNORMAL HIGH (ref 70–99)
Glucose-Capillary: 137 mg/dL — ABNORMAL HIGH (ref 70–99)
Glucose-Capillary: 140 mg/dL — ABNORMAL HIGH (ref 70–99)
Glucose-Capillary: 156 mg/dL — ABNORMAL HIGH (ref 70–99)
Glucose-Capillary: 168 mg/dL — ABNORMAL HIGH (ref 70–99)

## 2019-04-19 LAB — C-REACTIVE PROTEIN: CRP: 7.1 mg/dL — ABNORMAL HIGH (ref <1.0)

## 2019-04-19 LAB — MAGNESIUM: Magnesium: 2.3 mg/dL (ref 1.7–2.4)

## 2019-04-19 LAB — D-DIMER, QUANTITATIVE: D-Dimer, Quant: 1.56 ug/mL-FEU — ABNORMAL HIGH (ref 0.00–0.50)

## 2019-04-19 MED ORDER — FUROSEMIDE 10 MG/ML IJ SOLN
40.0000 mg | Freq: Two times a day (BID) | INTRAMUSCULAR | Status: DC
  Administered 2019-04-19 (×2): 40 mg via INTRAVENOUS
  Filled 2019-04-19 (×3): qty 4

## 2019-04-19 MED ORDER — IBUPROFEN 100 MG/5ML PO SUSP
600.0000 mg | Freq: Three times a day (TID) | ORAL | Status: DC | PRN
  Administered 2019-04-19 – 2019-04-22 (×6): 600 mg via ORAL
  Filled 2019-04-19 (×7): qty 30

## 2019-04-19 NOTE — Progress Notes (Addendum)
NAMEDavaughn Richard, MRN:  [id number], DOB:  [birth year], LOS: 5 ADMISSION DATE:  04/18/2019, CONSULTATION DATE:  04/16/2019 REFERRING MD:  TRH - Ghimire, CHIEF COMPLAINT:  Acute hypoxemic respiratory failure   Brief History   58 year old alcoholic male with COVID 19 pneumonia induced acute hypoxemic respiratory failure and alcohol withdrawal who became obtunded and hypoxemic and was intubated emergently.  PCCM consulted for sedation and vent management.  Past Medical History  Etoh abuse  Significant Hospital Events   11/17 intubation for respiratory failure  Consults:  PCCM  Procedures:  ETT 11/17>>>  Significant Diagnostic Tests:    Micro Data:  COVID 19 positive Blood cx 11/15>>>NTD  Antimicrobials:  Remdisivir 11/15>>>  Rocephin 11/15>>> Zithromax 11/15>>>  Interim history/subjective:  Continues on the vent.  No acute events.  Objective   Blood pressure 128/74, pulse 60, temperature (!) 100.9 F (38.3 C), resp. rate (!) 30, height 5\' 8"  (1.727 m), weight 80.7 kg, SpO2 95 %.    Vent Mode: PCV FiO2 (%):  [40 %-45 %] 40 % Set Rate:  [30 bmp] 30 bmp PEEP:  [10 cmH20] 10 cmH20 Plateau Pressure:  [13 cmH20-25 cmH20] 24 cmH20   Intake/Output Summary (Last 24 hours) at 04/19/2019 0819 Last data filed at 04/19/2019 0700 Gross per 24 hour  Intake 2183.11 ml  Output 1605 ml  Net 578.11 ml   Filed Weights   04/15/19 0550 04/18/19 0333 04/19/19 0500  Weight: 95.6 kg 94 kg 80.7 kg    Examination: Gen:      No acute distress HEENT:  EOMI, sclera anicteric Neck:     No masses; no thyromegaly Lungs:    Clear to auscultation bilaterally; normal respiratory effort CV:         Regular rate and rhythm; no murmurs Abd:      + bowel sounds; soft, non-tender; no palpable masses, no distension Ext:    No edema; adequate peripheral perfusion Skin:      Warm and dry; no rash Neuro: alert and oriented x 3 Psych: normal mood and affect   Resolved Hospital Problem list    N/A  Assessment & Plan:  58 year old male with COVID-19 and alcohol withdrawal now in acute hypoxemic respiratory failure with diffuse infiltrate.  VDRF: Continue ARDSnet protocol Wean down PEEP/FiO2 Pressure support weans when mental status improves Continue Decadron, remdesivir. Start Lasix for diuresis.  CAP: Continue ceftriaxone, Zithromax  ETOH withdrawal: On Precedex, Versed drip.  Will wean Versed first Continue thiamine, folate, MVI  FEN: Continue tube feeds.   Best practice:  Diet: Tube feeds Pain/Anxiety/Delirium protocol (if indicated): Versed, fentanyl VAP protocol (if indicated): Ordered DVT prophylaxis: Lovenox GI prophylaxis: Pepcid Glucose control: Monitor sugars Mobility: Bed Code Status: Full code Family Communication: Called wife on telephone but no answer 11/20 Disposition: ICU   Labs   CBC: Recent Labs  Lab 04/15/19 0900 04/16/19 0750  04/17/19 0542 04/17/19 0645 04/18/19 0327 04/18/19 0427 04/19/19 0337  WBC 10.0 11.5*  --  10.5  --   --  11.5* 10.6*  NEUTROABS 8.9* 9.9*  --  8.3*  --   --  9.3* 8.6*  HGB 11.6* 11.1*   < > 10.7* 11.6* 12.2* 10.9* 10.4*  HCT 40.6 40.3   < > 37.5* 34.0* 36.0* 39.2 37.1*  MCV 73.3* 73.9*  --  73.0*  --   --  74.1* 73.0*  PLT 375 478*  --  430*  --   --  503* 531*   < > =  values in this interval not displayed.    Basic Metabolic Panel: Recent Labs  Lab 04/15/19 0900 04/16/19 0750  04/17/19 0542 04/17/19 0645 04/18/19 0327 04/18/19 0427 04/19/19 0337  NA 141 145   < > 143 144 149* 147* 149*  K 4.0 3.9   < > 4.1 3.8 4.0 4.2 4.3  CL 105 103  --  101  --   --  104 108  CO2 26 33*  --  30  --   --  33* 31  GLUCOSE 137* 121*  --  120*  --   --  148* 153*  BUN 16 24*  --  60*  --   --  45* 40*  CREATININE 0.70 0.81  --  1.85*  --   --  0.86 0.77  CALCIUM 8.4* 8.6*  --  8.5*  --   --  8.6* 8.6*  MG 2.2  2.1  --   --  2.4  --   --  2.5* 2.3  PHOS 2.9  2.8  --   --  3.2  --   --  3.5 3.2   < > =  values in this interval not displayed.   GFR: Estimated Creatinine Clearance: 110.4 mL/min (by C-G formula based on SCr of 0.77 mg/dL). Recent Labs  Lab 04/13/2019 1640  04/16/19 0750 04/17/19 0542 04/18/19 0427 04/19/19 0337  PROCALCITON 1.50  --   --   --   --   --   WBC 8.9   < > 11.5* 10.5 11.5* 10.6*  LATICACIDVEN 1.5  --   --   --   --   --    < > = values in this interval not displayed.    Liver Function Tests: Recent Labs  Lab 04/15/19 0900 04/16/19 0750 04/17/19 0542 04/18/19 0427 04/19/19 0337  AST 45* 34 29 38 36  ALT 40 34 26 27 28   ALKPHOS 94 94 82 91 90  BILITOT 0.4 0.8 0.4 0.4 0.7  PROT 7.4 7.4 6.9 7.2 6.7  ALBUMIN 2.9* 3.1* 2.8* 2.7* 2.5*   No results for input(s): LIPASE, AMYLASE in the last 168 hours. No results for input(s): AMMONIA in the last 168 hours.  ABG    Component Value Date/Time   PHART 7.380 04/18/2019 0327   PCO2ART 62.2 (H) 04/18/2019 0327   PO2ART 62.0 (L) 04/18/2019 0327   HCO3 36.5 (H) 04/18/2019 0327   TCO2 38 (H) 04/18/2019 0327   O2SAT 89.0 04/18/2019 0327     Coagulation Profile: No results for input(s): INR, PROTIME in the last 168 hours.  Cardiac Enzymes: No results for input(s): CKTOTAL, CKMB, CKMBINDEX, TROPONINI in the last 168 hours.  HbA1C: No results found for: HGBA1C  CBG: Recent Labs  Lab 04/17/19 1937 04/17/19 2312 04/18/19 0423 04/18/19 2348 04/19/19 0528  GLUCAP 122* 157* 137* 156* 140*   The patient is critically ill with multiple organ system failure and requires high complexity decision making for assessment and support, frequent evaluation and titration of therapies, advanced monitoring, review of radiographic studies and interpretation of complex data.   Critical Care Time devoted to patient care services, exclusive of separately billable procedures, described in this note is 35 minutes.   Marshell Garfinkel MD [place] [facility] Pager [phone] If no answer call [phone]  04/19/2019, 8:23 AM

## 2019-04-19 NOTE — Progress Notes (Signed)
EtTT flushed with 10 mL normal saline per MD order.  Moderate tan thick secretions obtained.

## 2019-04-19 NOTE — Progress Notes (Signed)
Lavaged pt with 10 cc saline flush per MD order. Small amount of tan thick secretions suctioned out. Pt anxious during suctioning but in no distress. VS within normal limits.

## 2019-04-20 ENCOUNTER — Inpatient Hospital Stay (HOSPITAL_COMMUNITY): Payer: HRSA Program

## 2019-04-20 DIAGNOSIS — J1289 Other viral pneumonia: Secondary | ICD-10-CM

## 2019-04-20 DIAGNOSIS — J80 Acute respiratory distress syndrome: Secondary | ICD-10-CM

## 2019-04-20 DIAGNOSIS — U071 COVID-19: Principal | ICD-10-CM

## 2019-04-20 DIAGNOSIS — J069 Acute upper respiratory infection, unspecified: Secondary | ICD-10-CM

## 2019-04-20 LAB — PROCALCITONIN: Procalcitonin: 1.02 ng/mL

## 2019-04-20 LAB — BASIC METABOLIC PANEL
Anion gap: 11 (ref 5–15)
BUN: 64 mg/dL — ABNORMAL HIGH (ref 6–20)
CO2: 30 mmol/L (ref 22–32)
Calcium: 8.3 mg/dL — ABNORMAL LOW (ref 8.9–10.3)
Chloride: 107 mmol/L (ref 98–111)
Creatinine, Ser: 1.51 mg/dL — ABNORMAL HIGH (ref 0.61–1.24)
GFR calc Af Amer: >60 mL/min (ref >60)
GFR calc non Af Amer: 54 mL/min — ABNORMAL LOW (ref >60)
Glucose, Bld: 167 mg/dL — ABNORMAL HIGH (ref 70–99)
Potassium: 4.6 mmol/L (ref 3.5–5.1)
Sodium: 148 mmol/L — ABNORMAL HIGH (ref 135–145)

## 2019-04-20 LAB — CBC WITH DIFFERENTIAL/PLATELET
Abs Immature Granulocytes: 0.17 10*3/uL — ABNORMAL HIGH (ref 0.00–0.07)
Basophils Absolute: 0.1 10*3/uL (ref 0.0–0.1)
Basophils Relative: 1 %
Eosinophils Absolute: 0 10*3/uL (ref 0.0–0.5)
Eosinophils Relative: 0 %
HCT: 44.5 % (ref 39.0–52.0)
Hemoglobin: 12.3 g/dL — ABNORMAL LOW (ref 13.0–17.0)
Immature Granulocytes: 1 %
Lymphocytes Relative: 6 %
Lymphs Abs: 1.1 10*3/uL (ref 0.7–4.0)
MCH: 20.4 pg — ABNORMAL LOW (ref 26.0–34.0)
MCHC: 27.6 g/dL — ABNORMAL LOW (ref 30.0–36.0)
MCV: 73.7 fL — ABNORMAL LOW (ref 80.0–100.0)
Monocytes Absolute: 1 10*3/uL (ref 0.1–1.0)
Monocytes Relative: 5 %
Neutro Abs: 16.8 10*3/uL — ABNORMAL HIGH (ref 1.7–7.7)
Neutrophils Relative %: 87 %
Platelets: 631 10*3/uL — ABNORMAL HIGH (ref 150–400)
RBC: 6.04 MIL/uL — ABNORMAL HIGH (ref 4.22–5.81)
RDW: 20.6 % — ABNORMAL HIGH (ref 11.5–15.5)
WBC: 19.3 10*3/uL — ABNORMAL HIGH (ref 4.0–10.5)
nRBC: 0.1 % (ref 0.0–0.2)

## 2019-04-20 LAB — GLUCOSE, CAPILLARY
Glucose-Capillary: 218 mg/dL — ABNORMAL HIGH (ref 70–99)
Glucose-Capillary: 164 mg/dL — ABNORMAL HIGH (ref 70–99)
Glucose-Capillary: 197 mg/dL — ABNORMAL HIGH (ref 70–99)
Glucose-Capillary: 196 mg/dL — ABNORMAL HIGH (ref 70–99)
Glucose-Capillary: 110 mg/dL — ABNORMAL HIGH (ref 70–99)

## 2019-04-20 LAB — COMPREHENSIVE METABOLIC PANEL
ALT: 34 U/L (ref 0–44)
AST: 44 U/L — ABNORMAL HIGH (ref 15–41)
Albumin: 2.6 g/dL — ABNORMAL LOW (ref 3.5–5.0)
Alkaline Phosphatase: 107 U/L (ref 38–126)
Anion gap: 13 (ref 5–15)
BUN: 50 mg/dL — ABNORMAL HIGH (ref 6–20)
CO2: 29 mmol/L (ref 22–32)
Calcium: 8.6 mg/dL — ABNORMAL LOW (ref 8.9–10.3)
Chloride: 107 mmol/L (ref 98–111)
Creatinine, Ser: 1.3 mg/dL — ABNORMAL HIGH (ref 0.61–1.24)
GFR calc Af Amer: >60 mL/min (ref >60)
GFR calc non Af Amer: >60 mL/min (ref >60)
Glucose, Bld: 228 mg/dL — ABNORMAL HIGH (ref 70–99)
Potassium: 3.9 mmol/L (ref 3.5–5.1)
Sodium: 149 mmol/L — ABNORMAL HIGH (ref 135–145)
Total Bilirubin: 0.8 mg/dL (ref 0.3–1.2)
Total Protein: 7.5 g/dL (ref 6.5–8.1)

## 2019-04-20 LAB — MAGNESIUM: Magnesium: 3 mg/dL — ABNORMAL HIGH (ref 1.7–2.4)

## 2019-04-20 LAB — FERRITIN: Ferritin: 79 ng/mL (ref 24–336)

## 2019-04-20 LAB — HEMOGLOBIN A1C
Hgb A1c MFr Bld: 6.5 % — ABNORMAL HIGH (ref 4.8–5.6)
Mean Plasma Glucose: 139.85 mg/dL

## 2019-04-20 LAB — C-REACTIVE PROTEIN: CRP: 18.9 mg/dL — ABNORMAL HIGH (ref <1.0)

## 2019-04-20 LAB — D-DIMER, QUANTITATIVE: D-Dimer, Quant: 1.73 ug/mL-FEU — ABNORMAL HIGH (ref 0.00–0.50)

## 2019-04-20 MED ORDER — MIDAZOLAM HCL 2 MG/2ML IJ SOLN
2.0000 mg | Freq: Once | INTRAMUSCULAR | Status: AC
  Administered 2019-04-20 (×2): 2 mg via INTRAVENOUS

## 2019-04-20 MED ORDER — AMIODARONE HCL IN DEXTROSE 360-4.14 MG/200ML-% IV SOLN: 60.0000 mg/h | INTRAVENOUS | Status: DC

## 2019-04-20 MED ORDER — LORAZEPAM 2 MG/ML IJ SOLN
2.0000 mg | INTRAMUSCULAR | Status: DC
  Administered 2019-04-20 – 2019-04-22 (×10): 2 mg via INTRAVENOUS
  Filled 2019-04-20 (×10): qty 1

## 2019-04-20 MED ORDER — SODIUM CHLORIDE 0.9 % IV SOLN
2.0000 g | Freq: Three times a day (TID) | INTRAVENOUS | Status: DC
  Administered 2019-04-20 – 2019-04-22 (×6): 2 g via INTRAVENOUS
  Filled 2019-04-20 (×6): qty 2

## 2019-04-20 MED ORDER — METOPROLOL TARTRATE 5 MG/5ML IV SOLN
5.0000 mg | Freq: Once | INTRAVENOUS | Status: AC
  Administered 2019-04-20: 5 mg via INTRAVENOUS
  Filled 2019-04-20: qty 5

## 2019-04-20 MED ORDER — PHENYLEPHRINE HCL-NACL 10-0.9 MG/250ML-% IV SOLN
0.0000 ug/min | INTRAVENOUS | Status: DC
  Administered 2019-04-20: 20 ug/min via INTRAVENOUS
  Administered 2019-04-20 – 2019-04-21 (×2): 40 ug/min via INTRAVENOUS
  Administered 2019-04-21: 50 ug/min via INTRAVENOUS
  Administered 2019-04-21 (×2): 40 ug/min via INTRAVENOUS
  Administered 2019-04-21: 50 ug/min via INTRAVENOUS
  Administered 2019-04-22: 32 ug/min via INTRAVENOUS
  Filled 2019-04-20 (×11): qty 250

## 2019-04-20 MED ORDER — MIDAZOLAM HCL 2 MG/2ML IJ SOLN
INTRAMUSCULAR | Status: AC
  Administered 2019-04-20: 2 mg via INTRAVENOUS
  Filled 2019-04-20: qty 2

## 2019-04-20 MED ORDER — AMIODARONE IV BOLUS ONLY 150 MG/100ML
150.0000 mg | Freq: Once | INTRAVENOUS | Status: AC
  Administered 2019-04-20: 150 mg via INTRAVENOUS
  Filled 2019-04-20: qty 100

## 2019-04-20 MED ORDER — INSULIN ASPART 100 UNIT/ML ~~LOC~~ SOLN
0.0000 [IU] | SUBCUTANEOUS | Status: DC
  Administered 2019-04-20 – 2019-04-21 (×6): 3 [IU] via SUBCUTANEOUS
  Administered 2019-04-21: 20:00:00 2 [IU] via SUBCUTANEOUS
  Administered 2019-04-21 – 2019-04-22 (×2): 3 [IU] via SUBCUTANEOUS
  Administered 2019-04-22 (×4): 2 [IU] via SUBCUTANEOUS

## 2019-04-20 MED ORDER — AMIODARONE HCL IN DEXTROSE 360-4.14 MG/200ML-% IV SOLN
60.0000 mg/h | INTRAVENOUS | Status: DC
  Administered 2019-04-20 (×2): 60 mg/h via INTRAVENOUS
  Filled 2019-04-20: qty 200

## 2019-04-20 MED ORDER — AMIODARONE HCL IN DEXTROSE 360-4.14 MG/200ML-% IV SOLN: 30.0000 mg/h | INTRAVENOUS | Status: DC

## 2019-04-20 MED ORDER — METOPROLOL TARTRATE 5 MG/5ML IV SOLN
5.0000 mg | Freq: Once | INTRAVENOUS | Status: AC
  Administered 2019-04-20: 20:00:00 5 mg via INTRAVENOUS
  Filled 2019-04-20: qty 5

## 2019-04-20 MED ORDER — AMIODARONE HCL IN DEXTROSE 360-4.14 MG/200ML-% IV SOLN
60.0000 mg/h | INTRAVENOUS | Status: DC
  Administered 2019-04-20 (×2): 60 mg/h via INTRAVENOUS
  Filled 2019-04-20 (×2): qty 200

## 2019-04-20 MED ORDER — FREE WATER
250.0000 mL | Freq: Three times a day (TID) | Status: DC
  Administered 2019-04-20 – 2019-04-22 (×8): 250 mL

## 2019-04-20 MED ORDER — AMIODARONE LOAD VIA INFUSION
150.0000 mg | Freq: Once | INTRAVENOUS | Status: AC
  Administered 2019-04-20: 150 mg via INTRAVENOUS
  Filled 2019-04-20: qty 83.34

## 2019-04-20 MED ORDER — MIDAZOLAM HCL (PF) 10 MG/2ML IJ SOLN
2.0000 mg | Freq: Once | INTRAMUSCULAR | Status: AC
  Administered 2019-04-20: 2 mg via INTRAVENOUS

## 2019-04-20 MED ORDER — DEXMEDETOMIDINE HCL IN NACL 400 MCG/100ML IV SOLN: 0.4000 ug/kg/h | INTRAVENOUS | Status: DC

## 2019-04-20 MED ORDER — LORAZEPAM 2 MG/ML IJ SOLN
2.0000 mg | Freq: Four times a day (QID) | INTRAMUSCULAR | Status: DC
  Administered 2019-04-20: 2 mg via INTRAVENOUS
  Filled 2019-04-20 (×2): qty 1

## 2019-04-20 NOTE — Progress Notes (Signed)
Patient opens eyes to any stimuli, makes eye contact, but does not follow commands. RN who speaks spanish translated commands to patient, still didn't follow. Patient will nod head yes and no. Moves all extremities strong. Thrashes head side to side with oral care. Patient very agitated with any stimulation.

## 2019-04-20 NOTE — Progress Notes (Signed)
someone called claiming to be the pt's girlfriend and that he spoke to the pt on Monday and he told her that he will be here for two more days he has pneumonia. She wanted to know if the pt has covid; I told her I wasn't able to tell her anything the pt is married and we have the wife contact information. She want to be contacted if the pt pass away her name is Tiffany Person [phone] or [phone].

## 2019-04-20 NOTE — Procedures (Addendum)
Central Venous Catheter Insertion Procedure Note Ron Beske [id number] 01/25/1973  Procedure: Insertion of Central Venous Catheter Indications: Drug and/or fluid administration  Procedure Details Consent: Unable to obtain consent because of emergent medical necessity. Time Out: Verified patient identification, verified procedure, site/side was marked, verified correct patient position, special equipment/implants available, medications/allergies/relevent history reviewed, required imaging and test results available.  Performed  Maximum sterile technique was used including antiseptics, cap, gloves, gown, hand hygiene, mask and sheet. Skin prep: Chlorhexidine; local anesthetic administered A antimicrobial bonded/coated triple lumen catheter was placed in the right internal jugular vein using the Seldinger technique.  Evaluation Blood flow good Complications: No apparent complications Patient did tolerate procedure well. Chest X-ray ordered to verify placement.  CXR: pending.  Tywon Niday 04/20/2019, 7:24 PM

## 2019-04-20 NOTE — Progress Notes (Addendum)
Pharmacy Antibiotic Note  Spencer Richard is a 59 y.o. male admitted on 04/26/2019 with pneumonia.  Pharmacy has been consulted for Cefepime dosing.  Plan: Cefepime 2g IV q8h Follow up renal function, culture results, and clinical course.  Height: 5' 8" (172.7 cm) Weight: 179 lb 0.2 oz (81.2 kg) IBW/kg (Calculated) : 68.4  Temp (24hrs), Avg:101.6 F (38.7 C), Min:100.4 F (38 C), Max:102.9 F (39.4 C)  Recent Labs  Lab 04/26/2019 1640  04/16/19 0750 04/17/19 0542 04/18/19 0427 04/19/19 0337 04/20/19 0330  WBC 8.9   < > 11.5* 10.5 11.5* 10.6* 19.3*  CREATININE 0.91   < > 0.81 1.85* 0.86 0.77 1.30*  LATICACIDVEN 1.5  --   --   --   --   --   --    < > = values in this interval not displayed.    Estimated Creatinine Clearance: 68 mL/min (A) (by C-G formula based on SCr of 1.3 mg/dL (H)).    No Known Allergies  Antimicrobials this admission: 11/15 Remdesivir >> 11/19 11/16 Ceftriaxone/Azith >> (11/20) 11/21 Cefepime >>   Dose adjustments this admission:   Microbiology results: 11/15 BCx: ngtd 11/15 SARS coronavirus 2: positive 11/15 MRSA PCR: neg 11/21 BCx:  11/21 Resp Cxt:  Thank you for allowing pharmacy to be a part of this patient's care.  Nael Petrosyan PharmD, BCPS Clinical pharmacist phone 7am- 5pm: [phone] 04/20/2019 11:05 AM   

## 2019-04-20 NOTE — Progress Notes (Signed)
Someone called saying he is the pt's nephew and that he was told by the pt's girlfriend that the pt is in a coma. I told him that I didn't say that to the girlfriend and that we need to find out who his actual family is so that we can give information to the right people. The callers name is Stefan Church [phone]. He did conform the pt's name, date of birth and address. I place him on hold to go talk to Charge nurse about the situation when I got back he had hang up already.

## 2019-04-20 NOTE — Progress Notes (Signed)
ETT flushed with 10 mL normal saline per MD order. Small clear/tan thick secretions obtained.

## 2019-04-20 NOTE — Progress Notes (Signed)
[facility] telephone number is [phone].

## 2019-04-20 NOTE — Progress Notes (Signed)
Pt stayed in A fib With RVR from 1500 up until the end of my shift provider has been aware at all time. HR has start 150-183 then after 5mg  of metoprolol and 2 bolus of amio and drip HR has been 135-151. Provider came at bedside around 1810 for placement of Central line due to pt need of metoprolol with low BP in case pt needs higher dose of pressors. During line placement pt had received multiple bolus of fentanyl IV and versed IV. X-ray has been ordered. Report has been given in coming nurse has been made aware that provider wants to keep Amio drip at 60mg .

## 2019-04-20 NOTE — Progress Notes (Addendum)
PCCM progress note  Went into rapid atrial fibrillation in the afternoon Given 5 mg Lopressor Amiodarone 150 mg bolus x2 and amnio drip Needed Neo-Synephrine peripherally for low blood pressure  We will place central line Repeat Lopressor x1  The patient is critically ill with multiple organ system failure and requires high complexity decision making for assessment and support, frequent evaluation and titration of therapies, advanced monitoring, review of radiographic studies and interpretation of complex data.   Critical Care Time devoted to patient care services, exclusive of separately billable procedures, described in this note is 35 minutes.   Marshell Garfinkel MD [place] [facility] Pager [phone] If no answer call [phone] 04/20/2019, 7:23 PM

## 2019-04-20 NOTE — Progress Notes (Addendum)
eLink Physician-Brief Progress Note Patient Name: Spencer Richard DOB: Feb 05, 1961 MRN: [id number]   Date of Service  04/20/2019  HPI/Events of Note  Multiple issues: 1. AFIB with RVR - HR = 162. Currently on a Amiodarone IV infusion and 2. Agitation.   eICU Interventions  Will order: 1. Bolus with Amiodarone 150 mg IV over 10 minutes now.  2. BMP and Mg++ level STAT.  3. Increase standing Ativan 2 mg IV to Q 4 hours.      Intervention Category Major Interventions: Arrhythmia - evaluation and management;Delirium, psychosis, severe agitation - evaluation and management  Jdyn Parkerson [place] 04/20/2019, 9:02 PM

## 2019-04-20 NOTE — Progress Notes (Signed)
eLink Physician-Brief Progress Note Patient Name: Vallery Sa DOB: 01/25/1973 MRN: [id number]   Date of Service  04/20/2019  HPI/Events of Note  Ventilator Saynchrony  eICU Interventions  Will increase ceiling on Fentanyl IV infusion to 400 mcg/hour.      Intervention Category Major Interventions: Respiratory failure - evaluation and management  Tahmid Stonehocker Eugene 04/20/2019, 10:38 PM

## 2019-04-20 NOTE — Progress Notes (Signed)
Pt has been on A fib with RVR since 1500 provider is aware 5mg  metoprolol has been given which didn't help resolve the issue so amio bolus 150mg  was given and he is currently on an amio drip. Pt is still on A fib RVR provider has been notified more orders are being placed.

## 2019-04-20 NOTE — Progress Notes (Addendum)
NAMEKeyondre Richard, MRN:  [id number], DOB:  January 09, 1961, LOS: 6 ADMISSION DATE:  04/17/19, CONSULTATION DATE:  04/16/2019 REFERRING MD:  TRH - Ghimire, CHIEF COMPLAINT:  Acute hypoxemic respiratory failure   Brief History   58 year old alcoholic male with COVID 19 pneumonia induced acute hypoxemic respiratory failure and alcohol withdrawal who became obtunded and hypoxemic and was intubated emergently.  PCCM consulted for sedation and vent management.  Past Medical History  Etoh abuse  Significant Hospital Events   11/17 intubation for respiratory failure, severe bradycardia on precedex requiring atropine   Consults:  PCCM  Procedures:  ETT 11/17>>>  Significant Diagnostic Tests:    Micro Data:  COVID 19 positive Blood cx 11/15>>>NTD  Antimicrobials:  Remdisivir 11/15>>>  Rocephin 11/15>>> Zithromax 11/15>>>  Interim history/subjective:  Continues on the vent.  No acute events.  Objective   Blood pressure 129/79, pulse 89, temperature (!) 101.3 F (38.5 C), temperature source Rectal, resp. rate (!) 30, height 5\' 8"  (1.727 m), weight 81.2 kg, SpO2 96 %.    Vent Mode: PCV FiO2 (%):  [40 %] 40 % Set Rate:  [30 bmp] 30 bmp PEEP:  [8 cmH20-10 cmH20] 8 cmH20 Pressure Support:  [10 cmH20] 10 cmH20 Plateau Pressure:  [21 cmH20-25 cmH20] 22 cmH20   Intake/Output Summary (Last 24 hours) at 04/20/2019 0841 Last data filed at 04/20/2019 0800 Gross per 24 hour  Intake 2191.29 ml  Output 5350 ml  Net -3158.71 ml   Filed Weights   04/18/19 0333 04/19/19 0500 04/20/19 0500  Weight: 94 kg 80.7 kg 81.2 kg    Examination: Gen:      No acute distress HEENT:  EOMI, sclera anicteric Neck:     No masses; no thyromegaly, ETT Lungs:    Clear to auscultation bilaterally; normal respiratory effort CV:         Regular rate and rhythm; no murmurs Abd:      + bowel sounds; soft, non-tender; no palpable masses, no distension Ext:    No edema; adequate peripheral perfusion Skin:       Warm and dry; no rash Neuro: Somnolent, arousable  Resolved Hospital Problem list   N/A  Assessment & Plan:  58 year old male with COVID-19 and alcohol withdrawal now in acute hypoxemic respiratory failure with diffuse infiltrate.  VDRF: Continue ARDSnet protocol Wean down PEEP/FiO2 PSV weans as tolerated Continue Decadron, remdesivir. Hold lasix today as  Cr is higher  CAP: Finished ceftriaxone and Zithromax But WBC count continues to climb with persistent fevers Start cefepime Repeat blood, sputum cultures  ETOH withdrawal: On fentanyl, Versed drip.  Wean sedation as tolerated Will add standing ativan He had severe bradycardic episode on precedex earlier this admission. Continue thiamine, folate, MVI  Elevated blood sugars Start sliding scale insulin  FEN: Continue tube feeds. Free water for hypernatremia  Best practice:  Diet: Tube feeds Pain/Anxiety/Delirium protocol (if indicated): Versed, fentanyl VAP protocol (if indicated): Ordered DVT prophylaxis: Lovenox GI prophylaxis: Pepcid Glucose control: Monitor sugars Mobility: Bed Code Status: Full code Family Communication: Called wife on telephone but no answer 11/20, 11/21 Disposition: ICU   Labs   CBC: Recent Labs  Lab 04/16/19 0750  04/17/19 0542 04/17/19 0645 04/18/19 0327 04/18/19 0427 04/19/19 0337 04/20/19 0330  WBC 11.5*  --  10.5  --   --  11.5* 10.6* 19.3*  NEUTROABS 9.9*  --  8.3*  --   --  9.3* 8.6* 16.8*  HGB 11.1*   < >  10.7* 11.6* 12.2* 10.9* 10.4* 12.3*  HCT 40.3   < > 37.5* 34.0* 36.0* 39.2 37.1* 44.5  MCV 73.9*  --  73.0*  --   --  74.1* 73.0* 73.7*  PLT 478*  --  430*  --   --  503* 531* 631*   < > = values in this interval not displayed.    Basic Metabolic Panel: Recent Labs  Lab 04/15/19 0900 04/16/19 0750  04/17/19 0542 04/17/19 0645 04/18/19 0327 04/18/19 0427 04/19/19 0337 04/20/19 0330  NA 141 145   < > 143 144 149* 147* 149* 149*  K 4.0 3.9   < > 4.1 3.8  4.0 4.2 4.3 3.9  CL 105 103  --  101  --   --  104 108 107  CO2 26 33*  --  30  --   --  33* 31 29  GLUCOSE 137* 121*  --  120*  --   --  148* 153* 228*  BUN 16 24*  --  60*  --   --  45* 40* 50*  CREATININE 0.70 0.81  --  1.85*  --   --  0.86 0.77 1.30*  CALCIUM 8.4* 8.6*  --  8.5*  --   --  8.6* 8.6* 8.6*  MG 2.2  2.1  --   --  2.4  --   --  2.5* 2.3  --   PHOS 2.9  2.8  --   --  3.2  --   --  3.5 3.2  --    < > = values in this interval not displayed.   GFR: Estimated Creatinine Clearance: 68 mL/min (A) (by C-G formula based on SCr of 1.3 mg/dL (H)). Recent Labs  Lab 06/24/2018 1640  04/17/19 0542 04/18/19 0427 04/19/19 0337 04/20/19 0330  PROCALCITON 1.50  --   --   --   --  1.02  WBC 8.9   < > 10.5 11.5* 10.6* 19.3*  LATICACIDVEN 1.5  --   --   --   --   --    < > = values in this interval not displayed.    Liver Function Tests: Recent Labs  Lab 04/16/19 0750 04/17/19 0542 04/18/19 0427 04/19/19 0337 04/20/19 0330  AST 34 29 38 36 44*  ALT 34 26 27 28  34  ALKPHOS 94 82 91 90 107  BILITOT 0.8 0.4 0.4 0.7 0.8  PROT 7.4 6.9 7.2 6.7 7.5  ALBUMIN 3.1* 2.8* 2.7* 2.5* 2.6*   No results for input(s): LIPASE, AMYLASE in the last 168 hours. No results for input(s): AMMONIA in the last 168 hours.  ABG    Component Value Date/Time   PHART 7.380 04/18/2019 0327   PCO2ART 62.2 (H) 04/18/2019 0327   PO2ART 62.0 (L) 04/18/2019 0327   HCO3 36.5 (H) 04/18/2019 0327   TCO2 38 (H) 04/18/2019 0327   O2SAT 89.0 04/18/2019 0327     Coagulation Profile: No results for input(s): INR, PROTIME in the last 168 hours.  Cardiac Enzymes: No results for input(s): CKTOTAL, CKMB, CKMBINDEX, TROPONINI in the last 168 hours.  HbA1C: No results found for: HGBA1C  CBG: Recent Labs  Lab 04/19/19 0528 04/19/19 2007 04/19/19 2346 04/20/19 0447 04/20/19 0802  GLUCAP 140* 137* 168* 218* 164*   The patient is critically ill with multiple organ system failure and requires high  complexity decision making for assessment and support, frequent evaluation and titration of therapies, advanced monitoring, review of radiographic studies and interpretation  of complex data.   Critical Care Time devoted to patient care services, exclusive of separately billable procedures, described in this note is 45 minutes.   Marshell Garfinkel MD [place] [facility] Pager [phone] If no answer call [phone] 04/20/2019, 8:41 AM

## 2019-04-20 NOTE — Progress Notes (Incomplete Revision)
Pharmacy Antibiotic Note  Spencer Richard is a 58 y.o. male admitted on 04/25/2019 with pneumonia.  Pharmacy has been consulted for Cefepime dosing.  Plan: Cefepime 2g IV q8h Follow up renal function, culture results, and clinical course.  Height: 5\' 8"  (172.7 cm) Weight: 179 lb 0.2 oz (81.2 kg) IBW/kg (Calculated) : 68.4  Temp (24hrs), Avg:101.6 F (38.7 C), Min:100.4 F (38 C), Max:102.9 F (39.4 C)  Recent Labs  Lab 2019-04-25 1640  04/16/19 0750 04/17/19 0542 04/18/19 0427 04/19/19 0337 04/20/19 0330  WBC 8.9   < > 11.5* 10.5 11.5* 10.6* 19.3*  CREATININE 0.91   < > 0.81 1.85* 0.86 0.77 1.30*  LATICACIDVEN 1.5  --   --   --   --   --   --    < > = values in this interval not displayed.    Estimated Creatinine Clearance: 68 mL/min (A) (by C-G formula based on SCr of 1.3 mg/dL (H)).    No Known Allergies  Antimicrobials this admission: 11/15 Remdesivir >> 11/19 11/16 Ceftriaxone/Azith >> (11/20) 11/21 Cefepime >>   Dose adjustments this admission:   Microbiology results: 11/15 BCx: ngtd 11/15 SARS coronavirus 2: positive 11/15 MRSA PCR: neg 11/21 BCx:  11/21 Resp Cxt:  Thank you for allowing pharmacy to be a part of this patient's care.  Gretta Arab PharmD, BCPS Clinical pharmacist phone 7am- 5pm: [phone] 04/20/2019 11:05 AM

## 2019-04-21 ENCOUNTER — Inpatient Hospital Stay (HOSPITAL_COMMUNITY): Payer: HRSA Program

## 2019-04-21 ENCOUNTER — Inpatient Hospital Stay (HOSPITAL_COMMUNITY): Payer: Self-pay

## 2019-04-21 DIAGNOSIS — I517 Cardiomegaly: Secondary | ICD-10-CM

## 2019-04-21 LAB — GLUCOSE, CAPILLARY
Glucose-Capillary: 136 mg/dL — ABNORMAL HIGH (ref 70–99)
Glucose-Capillary: 154 mg/dL — ABNORMAL HIGH (ref 70–99)
Glucose-Capillary: 154 mg/dL — ABNORMAL HIGH (ref 70–99)
Glucose-Capillary: 157 mg/dL — ABNORMAL HIGH (ref 70–99)
Glucose-Capillary: 174 mg/dL — ABNORMAL HIGH (ref 70–99)
Glucose-Capillary: 178 mg/dL — ABNORMAL HIGH (ref 70–99)

## 2019-04-21 LAB — BASIC METABOLIC PANEL
Anion gap: 10 (ref 5–15)
BUN: 66 mg/dL — ABNORMAL HIGH (ref 6–20)
CO2: 28 mmol/L (ref 22–32)
Calcium: 8.3 mg/dL — ABNORMAL LOW (ref 8.9–10.3)
Chloride: 108 mmol/L (ref 98–111)
Creatinine, Ser: 1.17 mg/dL (ref 0.61–1.24)
GFR calc Af Amer: >60 mL/min (ref >60)
GFR calc non Af Amer: >60 mL/min (ref >60)
Glucose, Bld: 181 mg/dL — ABNORMAL HIGH (ref 70–99)
Potassium: 4.2 mmol/L (ref 3.5–5.1)
Sodium: 146 mmol/L — ABNORMAL HIGH (ref 135–145)

## 2019-04-21 LAB — ECHOCARDIOGRAM LIMITED
Height: 68 in
Weight: 2892.44 oz

## 2019-04-21 LAB — PHOSPHORUS: Phosphorus: 4.2 mg/dL (ref 2.5–4.6)

## 2019-04-21 LAB — CBC
HCT: 42.8 % (ref 39.0–52.0)
Hemoglobin: 12 g/dL — ABNORMAL LOW (ref 13.0–17.0)
MCH: 20.8 pg — ABNORMAL LOW (ref 26.0–34.0)
MCHC: 28 g/dL — ABNORMAL LOW (ref 30.0–36.0)
MCV: 74 fL — ABNORMAL LOW (ref 80.0–100.0)
Platelets: 716 10*3/uL — ABNORMAL HIGH (ref 150–400)
RBC: 5.78 MIL/uL (ref 4.22–5.81)
RDW: 21 % — ABNORMAL HIGH (ref 11.5–15.5)
WBC: 31.2 10*3/uL — ABNORMAL HIGH (ref 4.0–10.5)
nRBC: 0.2 % (ref 0.0–0.2)

## 2019-04-21 LAB — PROCALCITONIN: Procalcitonin: 0.82 ng/mL

## 2019-04-21 LAB — D-DIMER, QUANTITATIVE: D-Dimer, Quant: 1.54 ug/mL-FEU — ABNORMAL HIGH (ref 0.00–0.50)

## 2019-04-21 LAB — MAGNESIUM: Magnesium: 3 mg/dL — ABNORMAL HIGH (ref 1.7–2.4)

## 2019-04-21 LAB — BRAIN NATRIURETIC PEPTIDE: B Natriuretic Peptide: 597.7 pg/mL — ABNORMAL HIGH (ref 0.0–100.0)

## 2019-04-21 LAB — TROPONIN I (HIGH SENSITIVITY)
Troponin I (High Sensitivity): 42 ng/L — ABNORMAL HIGH (ref <18)
Troponin I (High Sensitivity): 45 ng/L — ABNORMAL HIGH (ref <18)

## 2019-04-21 LAB — C-REACTIVE PROTEIN: CRP: 33.2 mg/dL — ABNORMAL HIGH (ref <1.0)

## 2019-04-21 MED ORDER — AMIODARONE HCL IN DEXTROSE 360-4.14 MG/200ML-% IV SOLN
30.0000 mg/h | INTRAVENOUS | Status: DC
  Administered 2019-04-21 – 2019-04-22 (×3): 30 mg/h via INTRAVENOUS
  Filled 2019-04-21 (×3): qty 200

## 2019-04-21 MED ORDER — METOPROLOL TARTRATE 25 MG/10 ML ORAL SUSPENSION
100.0000 mg | Freq: Two times a day (BID) | ORAL | Status: DC
  Administered 2019-04-21 (×2): 100 mg
  Filled 2019-04-21 (×3): qty 40

## 2019-04-21 MED ORDER — METOPROLOL TARTRATE 5 MG/5ML IV SOLN
5.0000 mg | Freq: Once | INTRAVENOUS | Status: AC
  Administered 2019-04-21: 5 mg via INTRAVENOUS
  Filled 2019-04-21: qty 5

## 2019-04-21 NOTE — Progress Notes (Signed)
  Echocardiogram 2D Echocardiogram has been performed.  Spencer Richard 04/21/2019, 2:03 PM

## 2019-04-21 NOTE — Care Management (Addendum)
Late Entry for 04/20/19 Case manager spoke with ICU charge nurse concerning locating patient's family. CM contacted Leadership with situation. There is a note entered by a nurse on 04/15/19  that states patient doesn't want his wife notified. The number in demographics for wife goes to a company. No message was left. There is no mention of providing information to girlfriend. Social worker will follow.

## 2019-04-21 NOTE — Procedures (Addendum)
Central Venous Catheter Insertion Procedure Note Spencer Richard [id number] 05-15-1961  Procedure: Insertion of Central Venous Catheter Indications: Drug and/or fluid administration  Procedure Details Consent: Unable to obtain consent because of emergent medical necessity. Time Out: Verified patient identification, verified procedure, site/side was marked, verified correct patient position, special equipment/implants available, medications/allergies/relevent history reviewed, required imaging and test results available.  Performed  Maximum sterile technique was used including antiseptics, cap, gloves, gown, hand hygiene, mask and sheet. Skin prep: Chlorhexidine; local anesthetic administered  A antimicrobial bonded/coated triple lumen catheter was attempted in the left internal jugular vein using the Seldinger technique, under ultrasound guidance  The vein was easily accessed with needle and threaded but the wire got kinked while attempting to dilate the needle track due to dense subcutaneous tissue. Procedure aborted.  Evaluation Patient did tolerate procedure well. CXR pending  Marshell Garfinkel MD [place] [facility] 04/21/2019, 5:56 AM

## 2019-04-21 NOTE — Progress Notes (Signed)
Contacted family was here to visit patient. They stated his real name is [facility].2060/06/26. Philomena Course is the brother in law and Gilberto Better is the sister. Their phone number is [phone]. Explained we did not have visitation here. I would ask the MD to contact them. Spoke with Elyn Peers ICU charge RN. She stated she would contact patient placement with the new information

## 2019-04-21 NOTE — Care Management (Signed)
Patient's family has arrived at [facility] contacted Case manager, copy of photo ID received. Patient is [place] DOB: Sep 25, 2060. Contact person is his Sister and Brother in Law: Philomena Course, wife-Maria. [phone]. They would like to speak with MD. Case manager contacted ICU charge nurse and asked that she update MD. [facility] will get appropriate information to admissions.

## 2019-04-21 NOTE — Progress Notes (Addendum)
eLink Physician-Brief Progress Note Patient Name: Spencer Richard DOB: 05-Jan-1961 MRN: [id number]   Date of Service  04/21/2019  HPI/Events of Note  AFIB with RVR - Ventricular rate back up to 141. Currently on an Amiodarone IV infusion and has been bolused X 3. Also on Metoprolol 50 mg per tube Q 12 hours. Last K+ = 4.6 and Mg++ = 3.0.   eICU Interventions  Will order: 1. Metoprolol 5 mg IV X 1 now.  2. Increase Metoprolol to 100 mg per tube Q 12 hours.      Intervention Category Major Interventions: Arrhythmia - evaluation and management  Sommer,Steven Eugene 04/21/2019, 5:05 AM

## 2019-04-21 NOTE — Progress Notes (Signed)
Midazolam 15 mg wasted in stericycle by this nurse and witnessed by nurse Mayo.

## 2019-04-21 NOTE — Progress Notes (Signed)
Spoke to patient placement and admitting weekend staff. Patient's identity cannot be fixed in Epic until tomorrow during normal business hours. The plan is for ICU charge RN to call medical records at [phone] and then admitting at [phone]. The records will need to be amended and merged. This will need to be accomplished on a supervisory level. Social work team, Tour manager, and the attending MD are aware of the patients correct identity and family contact information.   To reiterate: The patient's correct name is Spencer Richard. His DOB is [birth year]. His sister is Gilberto Better. His brother-in-law is Philomena Course. Their phone number is [phone].

## 2019-04-21 NOTE — Care Management (Addendum)
Case manager has been working with to locate identity of patient in  48-1. Spoke with Arvilla Meres who confirms that it is not her husband Vallery Sa. It appears that her husband Shellee Milo, who works with the patient, dropped him at the urgent care on Sunday and gave hm his medicaid card to use. Case manager asked that Chrys Racer please have her husband contact CM with patient's true name.  1255: Case manager received a call from "[employer]" ho states patient's name is Rock Sobol, Shellee Milo is going to try to get me a birth date.

## 2019-04-21 NOTE — Plan of Care (Signed)
Tmax this shift=38.7 degrees celsius. Colling blanket and icepacks applied. Tylenol given. Temp now @ 37 degrees. Cooling blanket turned off as pt is starting to shiver. Bolus of amiodarone 150 mg and 5 mg metoprolol given to control heart rate. Fentanyl ceiling dose increased to 400 mcg due to desynchrony on the vent. Ativan 2 mg IV started. Problem: Clinical Measurements: Goal: Will remain free from infection Outcome: Progressing Goal: Diagnostic test results will improve Outcome: Progressing Goal: Respiratory complications will improve Outcome: Progressing   Problem: Activity: Goal: Risk for activity intolerance will decrease Outcome: Progressing   Problem: Nutrition: Goal: Adequate nutrition will be maintained Outcome: Progressing   Problem: Pain Managment: Goal: General experience of comfort will improve Outcome: Progressing   Problem: Safety: Goal: Ability to remain free from injury will improve Outcome: Progressing   Problem: Skin Integrity: Goal: Risk for impaired skin integrity will decrease Outcome: Progressing

## 2019-04-21 NOTE — Progress Notes (Addendum)
NAMTora Richard:  Spencer Richard, MRN:  [id number], DOB:  1960-06-17, LOS: 7 ADMISSION DATE:  04/13/2019, CONSULTATION DATE:  04/16/2019 REFERRING MD:  TRH - Ghimire, CHIEF COMPLAINT:  Acute hypoxemic respiratory failure   Brief History   58 year old alcoholic male with COVID 19 pneumonia induced acute hypoxemic respiratory failure and alcohol withdrawal who became obtunded and hypoxemic and was intubated emergently.  PCCM consulted for sedation and vent management.  Past Medical History  Etoh abuse  Significant Hospital Events   11/15-Admit 11/17- Intubation for respiratory failure, severe bradycardia on precedex requiring atropine 11/21- Persistent fevers, Rapid afib, started amio 11/22- Converted to NSR  Consults:  PCCM  Procedures:  ETT 11/17>>> Rt IJ CVL 11/21 >>  Significant Diagnostic Tests:    Micro Data:  COVID 19 positive Blood cx 11/15>>>NTD  Antimicrobials/COVID Rx  Remdisivir 11/15>>> 11/19 Rocephin 11/15>>> 11/20 Zithromax 11/16>>> 11/20  Cefepime 11/21 >> Decadron 11/16 >>  Interim history/subjective:  Went into rapid A. fib yesterday. Received 3 boluses of amiodarone and continues on amnio drip On Lopressor.  Objective   Blood pressure 120/73, pulse 93, temperature (!) 100.9 F (38.3 C), resp. rate (!) 30, height 5\' 8"  (1.727 m), weight 82 kg, SpO2 91 %.    Vent Mode: PCV FiO2 (%):  [40 %] 40 % Set Rate:  [30 bmp] 30 bmp PEEP:  [5 cmH20] 5 cmH20 Pressure Support:  [10 cmH20] 10 cmH20 Plateau Pressure:  [15 cmH20-32 cmH20] 20 cmH20   Intake/Output Summary (Last 24 hours) at 04/21/2019 0857 Last data filed at 04/21/2019 0700 Gross per 24 hour  Intake 4769.63 ml  Output 1760 ml  Net 3009.63 ml   Filed Weights   04/19/19 0500 04/20/19 0500 04/21/19 0350  Weight: 80.7 kg 81.2 kg 82 kg    Examination: Gen:      No acute distress HEENT:  EOMI, sclera anicteric Neck:     No masses; no thyromegaly, ETT Lungs:    Clear to auscultation bilaterally; normal  respiratory effort CV:         Regular rate and rhythm; no murmurs Abd:      + bowel sounds; soft, non-tender; no palpable masses, no distension Ext:    No edema; adequate peripheral perfusion Skin:      Warm and dry; no rash Neuro: Somnolent arousable  Resolved Hospital Problem list   N/A  Assessment & Plan:  58 year old male with COVID-19 and alcohol withdrawal now in acute hypoxemic respiratory failure with diffuse infiltrate.  VDRF: Continue ARDSnet protocol Wean down PEEP/FiO2 PSV weans as tolerated Continue Decadron Hold lasix today as Cr is higher  Rapid A. Fib > Now in NSR Continue Amio, Lopressor Wean off Neo-Synephrine Holding heparin gtt as he is in NSR Check echocardiogram, troponin  CAP: WBC count continues to climb with persistent fevers Started cefepime Follow blood, sputum cultures  ETOH withdrawal: On fentanyl, Versed drip.  Wean sedation as tolerated Added standing ativan He had severe bradycardic episode on precedex earlier this admission. Continue thiamine, folate, MVI  Elevated blood sugars SSI  FEN: Continue tube feeds. Free water for hypernatremia  Best practice:  Diet: Tube feeds Pain/Anxiety/Delirium protocol (if indicated): Versed, fentanyl VAP protocol (if indicated): Ordered DVT prophylaxis: Lovenox GI prophylaxis: Pepcid Glucose control: SSI Mobility: Bed Code Status: Full code Family Communication: Family now has been located.  Patient has been estranged from his family.  Called his brother-in-law Onalee HuaDavid on 9/22 and updated on clinical course.   Disposition: ICU  Labs   CBC: Recent Labs  Lab 04/16/19 0750  04/17/19 0542  04/18/19 0327 04/18/19 0427 04/19/19 0337 04/20/19 0330 04/21/19 0502  WBC 11.5*  --  10.5  --   --  11.5* 10.6* 19.3* 31.2*  NEUTROABS 9.9*  --  8.3*  --   --  9.3* 8.6* 16.8*  --   HGB 11.1*   < > 10.7*   < > 12.2* 10.9* 10.4* 12.3* 12.0*  HCT 40.3   < > 37.5*   < > 36.0* 39.2 37.1* 44.5 42.8  MCV  73.9*  --  73.0*  --   --  74.1* 73.0* 73.7* 74.0*  PLT 478*  --  430*  --   --  503* 531* 631* 716*   < > = values in this interval not displayed.    Basic Metabolic Panel: Recent Labs  Lab 04/15/19 0900  04/17/19 0542  04/18/19 0427 04/19/19 0337 04/20/19 0330 04/20/19 2120 04/21/19 0502  NA 141   < > 143   < > 147* 149* 149* 148* 146*  K 4.0   < > 4.1   < > 4.2 4.3 3.9 4.6 4.2  CL 105   < > 101  --  104 108 107 107 108  CO2 26   < > 30  --  33* 31 29 30 28   GLUCOSE 137*   < > 120*  --  148* 153* 228* 167* 181*  BUN 16   < > 60*  --  45* 40* 50* 64* 66*  CREATININE 0.70   < > 1.85*  --  0.86 0.77 1.30* 1.51* 1.17  CALCIUM 8.4*   < > 8.5*  --  8.6* 8.6* 8.6* 8.3* 8.3*  MG 2.2  2.1  --  2.4  --  2.5* 2.3  --  3.0* 3.0*  PHOS 2.9  2.8  --  3.2  --  3.5 3.2  --   --  4.2   < > = values in this interval not displayed.   GFR: Estimated Creatinine Clearance: 75.5 mL/min (by C-G formula based on SCr of 1.17 mg/dL). Recent Labs  Lab 04/18/2019 1640  04/18/19 0427 04/19/19 0337 04/20/19 0330 04/21/19 0502  PROCALCITON 1.50  --   --   --  1.02 0.82  WBC 8.9   < > 11.5* 10.6* 19.3* 31.2*  LATICACIDVEN 1.5  --   --   --   --   --    < > = values in this interval not displayed.    Liver Function Tests: Recent Labs  Lab 04/16/19 0750 04/17/19 0542 04/18/19 0427 04/19/19 0337 04/20/19 0330  AST 34 29 38 36 44*  ALT 34 26 27 28  34  ALKPHOS 94 82 91 90 107  BILITOT 0.8 0.4 0.4 0.7 0.8  PROT 7.4 6.9 7.2 6.7 7.5  ALBUMIN 3.1* 2.8* 2.7* 2.5* 2.6*   No results for input(s): LIPASE, AMYLASE in the last 168 hours. No results for input(s): AMMONIA in the last 168 hours.  ABG    Component Value Date/Time   PHART 7.380 04/18/2019 0327   PCO2ART 62.2 (H) 04/18/2019 0327   PO2ART 62.0 (L) 04/18/2019 0327   HCO3 36.5 (H) 04/18/2019 0327   TCO2 38 (H) 04/18/2019 0327   O2SAT 89.0 04/18/2019 0327     Coagulation Profile: No results for input(s): INR, PROTIME in the last 168  hours.  Cardiac Enzymes: No results for input(s): CKTOTAL, CKMB, CKMBINDEX, TROPONINI in the last 168 hours.  HbA1C: Hgb A1c MFr Bld  Date/Time Value Ref Range Status  04/20/2019 03:30 AM 6.5 (H) 4.8 - 5.6 % Final    Comment:    (NOTE) Pre diabetes:          5.7%-6.4% Diabetes:              >6.4% Glycemic control for   <7.0% adults with diabetes     CBG: Recent Labs  Lab 04/20/19 1556 04/20/19 2033 04/21/19 0020 04/21/19 0341 04/21/19 0839  GLUCAP 196* 110* 174* 154* 154*   The patient is critically ill with multiple organ system failure and requires high complexity decision making for assessment and support, frequent evaluation and titration of therapies, advanced monitoring, review of radiographic studies and interpretation of complex data.   Critical Care Time devoted to patient care services, exclusive of separately billable procedures, described in this note is 45 minutes.   Marshell Garfinkel MD [place] [facility] Pager [phone] If no answer call [phone] 04/21/2019, 8:57 AM

## 2019-04-22 ENCOUNTER — Inpatient Hospital Stay (HOSPITAL_COMMUNITY)
Admission: EM | Admit: 2019-04-22 | Discharge: 2019-04-30 | Disposition: E | Payer: HRSA Program | Attending: Pulmonary Disease | Admitting: Pulmonary Disease

## 2019-04-22 DIAGNOSIS — I480 Paroxysmal atrial fibrillation: Secondary | ICD-10-CM | POA: Diagnosis present

## 2019-04-22 DIAGNOSIS — J1282 Pneumonia due to coronavirus disease 2019: Secondary | ICD-10-CM | POA: Diagnosis present

## 2019-04-22 DIAGNOSIS — I10 Essential (primary) hypertension: Secondary | ICD-10-CM

## 2019-04-22 DIAGNOSIS — J9601 Acute respiratory failure with hypoxia: Secondary | ICD-10-CM | POA: Diagnosis present

## 2019-04-22 DIAGNOSIS — N179 Acute kidney failure, unspecified: Secondary | ICD-10-CM | POA: Diagnosis present

## 2019-04-22 DIAGNOSIS — J80 Acute respiratory distress syndrome: Secondary | ICD-10-CM | POA: Diagnosis present

## 2019-04-22 DIAGNOSIS — K567 Ileus, unspecified: Secondary | ICD-10-CM

## 2019-04-22 DIAGNOSIS — F322 Major depressive disorder, single episode, severe without psychotic features: Secondary | ICD-10-CM

## 2019-04-22 DIAGNOSIS — F10939 Alcohol use, unspecified with withdrawal, unspecified: Secondary | ICD-10-CM | POA: Diagnosis present

## 2019-04-22 DIAGNOSIS — I959 Hypotension, unspecified: Secondary | ICD-10-CM | POA: Diagnosis present

## 2019-04-22 DIAGNOSIS — J150 Pneumonia due to Klebsiella pneumoniae: Secondary | ICD-10-CM | POA: Diagnosis present

## 2019-04-22 DIAGNOSIS — F10239 Alcohol dependence with withdrawal, unspecified: Secondary | ICD-10-CM | POA: Diagnosis present

## 2019-04-22 DIAGNOSIS — Z452 Encounter for adjustment and management of vascular access device: Secondary | ICD-10-CM

## 2019-04-22 DIAGNOSIS — A419 Sepsis, unspecified organism: Secondary | ICD-10-CM | POA: Diagnosis present

## 2019-04-22 DIAGNOSIS — U071 COVID-19: Secondary | ICD-10-CM | POA: Diagnosis present

## 2019-04-22 DIAGNOSIS — N5089 Other specified disorders of the male genital organs: Secondary | ICD-10-CM

## 2019-04-22 DIAGNOSIS — J189 Pneumonia, unspecified organism: Secondary | ICD-10-CM | POA: Diagnosis present

## 2019-04-22 DIAGNOSIS — R7303 Prediabetes: Secondary | ICD-10-CM | POA: Diagnosis present

## 2019-04-22 HISTORY — DX: Essential (primary) hypertension: I10

## 2019-04-22 HISTORY — DX: Obesity, unspecified: E66.9

## 2019-04-22 HISTORY — DX: Anxiety disorder, unspecified: F41.9

## 2019-04-22 HISTORY — DX: Depression, unspecified: F32.A

## 2019-04-22 LAB — CULTURE, BLOOD (ROUTINE X 2)
Culture: NO GROWTH
Culture: NO GROWTH
Special Requests: ADEQUATE
Special Requests: ADEQUATE

## 2019-04-22 LAB — CBC
HCT: 33.9 % — ABNORMAL LOW (ref 39.0–52.0)
Hemoglobin: 9.7 g/dL — ABNORMAL LOW (ref 13.0–17.0)
MCH: 20.9 pg — ABNORMAL LOW (ref 26.0–34.0)
MCHC: 28.6 g/dL — ABNORMAL LOW (ref 30.0–36.0)
MCV: 72.9 fL — ABNORMAL LOW (ref 80.0–100.0)
Platelets: 570 10*3/uL — ABNORMAL HIGH (ref 150–400)
RBC: 4.65 MIL/uL (ref 4.22–5.81)
RDW: 20.2 % — ABNORMAL HIGH (ref 11.5–15.5)
WBC: 21.2 10*3/uL — ABNORMAL HIGH (ref 4.0–10.5)
nRBC: 0.1 % (ref 0.0–0.2)

## 2019-04-22 LAB — GLUCOSE, CAPILLARY
Glucose-Capillary: 133 mg/dL — ABNORMAL HIGH (ref 70–99)
Glucose-Capillary: 137 mg/dL — ABNORMAL HIGH (ref 70–99)
Glucose-Capillary: 141 mg/dL — ABNORMAL HIGH (ref 70–99)
Glucose-Capillary: 145 mg/dL — ABNORMAL HIGH (ref 70–99)
Glucose-Capillary: 149 mg/dL — ABNORMAL HIGH (ref 70–99)
Glucose-Capillary: 149 mg/dL — ABNORMAL HIGH (ref 70–99)
Glucose-Capillary: 169 mg/dL — ABNORMAL HIGH (ref 70–99)

## 2019-04-22 LAB — PHOSPHORUS: Phosphorus: 4.3 mg/dL (ref 2.5–4.6)

## 2019-04-22 LAB — BASIC METABOLIC PANEL
Anion gap: 10 (ref 5–15)
BUN: 91 mg/dL — ABNORMAL HIGH (ref 6–20)
CO2: 26 mmol/L (ref 22–32)
Calcium: 8 mg/dL — ABNORMAL LOW (ref 8.9–10.3)
Chloride: 114 mmol/L — ABNORMAL HIGH (ref 98–111)
Creatinine, Ser: 2.33 mg/dL — ABNORMAL HIGH (ref 0.61–1.24)
GFR calc Af Amer: 37 mL/min — ABNORMAL LOW (ref >60)
GFR calc non Af Amer: 32 mL/min — ABNORMAL LOW (ref >60)
Glucose, Bld: 142 mg/dL — ABNORMAL HIGH (ref 70–99)
Potassium: 4.4 mmol/L (ref 3.5–5.1)
Sodium: 150 mmol/L — ABNORMAL HIGH (ref 135–145)

## 2019-04-22 LAB — POCT I-STAT 7, (LYTES, BLD GAS, ICA,H+H)
Acid-Base Excess: 1 mmol/L (ref 0.0–2.0)
Bicarbonate: 26.9 mmol/L (ref 20.0–28.0)
Calcium, Ion: 1.17 mmol/L (ref 1.15–1.40)
HCT: 33 % — ABNORMAL LOW (ref 39.0–52.0)
Hemoglobin: 11.2 g/dL — ABNORMAL LOW (ref 13.0–17.0)
O2 Saturation: 94 %
Patient temperature: 38
Potassium: 4.4 mmol/L (ref 3.5–5.1)
Sodium: 148 mmol/L — ABNORMAL HIGH (ref 135–145)
TCO2: 28 mmol/L (ref 22–32)
pCO2 arterial: 52.1 mmHg — ABNORMAL HIGH (ref 32.0–48.0)
pH, Arterial: 7.326 — ABNORMAL LOW (ref 7.350–7.450)
pO2, Arterial: 82 mmHg — ABNORMAL LOW (ref 83.0–108.0)

## 2019-04-22 LAB — CULTURE, RESPIRATORY W GRAM STAIN: Special Requests: NORMAL

## 2019-04-22 LAB — MAGNESIUM: Magnesium: 2.9 mg/dL — ABNORMAL HIGH (ref 1.7–2.4)

## 2019-04-22 MED ORDER — MIDAZOLAM 50MG/50ML (1MG/ML) PREMIX INFUSION: 0.5000 mg/h | INTRAVENOUS | Status: DC

## 2019-04-22 MED ORDER — ORAL CARE MOUTH RINSE
15.0000 mL | OROMUCOSAL | Status: DC
  Administered 2019-04-22 – 2019-04-27 (×45): 15 mL via OROMUCOSAL

## 2019-04-22 MED ORDER — FENTANYL 2500MCG IN NS 250ML (10MCG/ML) PREMIX INFUSION
0.0000 ug/h | INTRAVENOUS | Status: DC
  Administered 2019-04-23: 250 ug/h via INTRAVENOUS
  Administered 2019-04-23: 350 ug/h via INTRAVENOUS
  Administered 2019-04-23 – 2019-04-24 (×2): 250 ug/h via INTRAVENOUS
  Administered 2019-04-24: 125 ug/h via INTRAVENOUS
  Administered 2019-04-25: 200 ug/h via INTRAVENOUS
  Administered 2019-04-25 – 2019-04-26 (×2): 300 ug/h via INTRAVENOUS
  Filled 2019-04-22 (×9): qty 250

## 2019-04-22 MED ORDER — INSULIN ASPART 100 UNIT/ML ~~LOC~~ SOLN: 0.0000 [IU] | SUBCUTANEOUS | Status: DC

## 2019-04-22 MED ORDER — MIDAZOLAM 50MG/50ML (1MG/ML) PREMIX INFUSION
0.0000 mg/h | INTRAVENOUS | Status: DC
  Administered 2019-04-22 – 2019-04-23 (×2): 2 mg/h via INTRAVENOUS
  Administered 2019-04-24: 17:00:00 1 mg/h via INTRAVENOUS
  Administered 2019-04-25 (×2): 4 mg/h via INTRAVENOUS
  Administered 2019-04-27: 04:00:00 3 mg/h via INTRAVENOUS
  Administered 2019-04-27: 15:00:00 6 mg/h via INTRAVENOUS
  Filled 2019-04-22 (×8): qty 50

## 2019-04-22 MED ORDER — ONDANSETRON HCL 4 MG PO TABS: 4.0000 mg | ORAL_TABLET | Freq: Four times a day (QID) | ORAL | Status: DC | PRN

## 2019-04-22 MED ORDER — FENTANYL BOLUS VIA INFUSION
50.0000 ug | INTRAVENOUS | Status: DC | PRN
  Administered 2019-04-22 – 2019-04-25 (×7): 50 ug via INTRAVENOUS
  Filled 2019-04-22: qty 50

## 2019-04-22 MED ORDER — LORAZEPAM 2 MG/ML IJ SOLN
2.0000 mg | Freq: Four times a day (QID) | INTRAMUSCULAR | Status: DC
  Administered 2019-04-22 – 2019-04-23 (×2): 2 mg via INTRAVENOUS
  Filled 2019-04-22 (×3): qty 1

## 2019-04-22 MED ORDER — DEXMEDETOMIDINE HCL IN NACL 400 MCG/100ML IV SOLN: 0.4000 ug/kg/h | INTRAVENOUS | Status: DC

## 2019-04-22 MED ORDER — VITAL AF 1.2 CAL PO LIQD
1000.0000 mL | ORAL | Status: DC
  Administered 2019-04-22: 1000 mL

## 2019-04-22 MED ORDER — LURASIDONE HCL 80 MG PO TABS
80.0000 mg | ORAL_TABLET | Freq: Every day | ORAL | Status: DC
  Filled 2019-04-22: qty 1

## 2019-04-22 MED ORDER — SODIUM CHLORIDE 0.9 % IV SOLN: 2.0000 g | Freq: Two times a day (BID) | INTRAVENOUS | Status: DC

## 2019-04-22 MED ORDER — AMIODARONE HCL IN DEXTROSE 360-4.14 MG/200ML-% IV SOLN
30.0000 mg/h | INTRAVENOUS | Status: DC
  Administered 2019-04-22 – 2019-04-26 (×9): 30 mg/h via INTRAVENOUS
  Filled 2019-04-22 (×9): qty 200

## 2019-04-22 MED ORDER — ADULT MULTIVITAMIN LIQUID CH
15.0000 mL | Freq: Every day | ORAL | Status: DC
  Filled 2019-04-22: qty 15

## 2019-04-22 MED ORDER — CHLORHEXIDINE GLUCONATE 0.12% ORAL RINSE (MEDLINE KIT)
15.0000 mL | Freq: Two times a day (BID) | OROMUCOSAL | Status: DC
  Administered 2019-04-22 – 2019-04-23 (×2): 15 mL via OROMUCOSAL

## 2019-04-22 MED ORDER — ENOXAPARIN SODIUM 40 MG/0.4ML ~~LOC~~ SOLN: 40.0000 mg | SUBCUTANEOUS | Status: DC

## 2019-04-22 MED ORDER — PHENYLEPHRINE HCL-NACL 10-0.9 MG/250ML-% IV SOLN
0.0000 ug/min | INTRAVENOUS | Status: DC
  Administered 2019-04-24: 50 ug/min via INTRAVENOUS
  Administered 2019-04-24: 70 ug/min via INTRAVENOUS
  Administered 2019-04-24: 40 ug/min via INTRAVENOUS
  Administered 2019-04-25 – 2019-04-26 (×2): 20 ug/min via INTRAVENOUS
  Administered 2019-04-27: 60 ug/min via INTRAVENOUS
  Administered 2019-04-27: 40 ug/min via INTRAVENOUS
  Administered 2019-04-27: 10 ug/min via INTRAVENOUS
  Filled 2019-04-22 (×8): qty 250

## 2019-04-22 MED ORDER — SODIUM CHLORIDE 0.9 % IV SOLN
2.0000 g | Freq: Two times a day (BID) | INTRAVENOUS | Status: DC
  Administered 2019-04-22 – 2019-04-23 (×2): 2 g via INTRAVENOUS
  Filled 2019-04-22 (×2): qty 2

## 2019-04-22 MED ORDER — ONDANSETRON HCL 4 MG/2ML IJ SOLN
4.0000 mg | Freq: Four times a day (QID) | INTRAMUSCULAR | Status: DC
  Administered 2019-04-22 – 2019-04-25 (×10): 4 mg via INTRAVENOUS
  Filled 2019-04-22 (×11): qty 2

## 2019-04-22 MED ORDER — LORAZEPAM 2 MG/ML IJ SOLN
2.0000 mg | Freq: Four times a day (QID) | INTRAMUSCULAR | Status: DC
  Administered 2019-04-22: 2 mg via INTRAVENOUS
  Filled 2019-04-22: qty 1

## 2019-04-22 MED ORDER — VITAMIN B-1 100 MG PO TABS: 100.0000 mg | ORAL_TABLET | Freq: Every day | ORAL | Status: DC

## 2019-04-22 MED ORDER — METHYLPREDNISOLONE SODIUM SUCC 40 MG IJ SOLR: 20.0000 mg | Freq: Every day | INTRAMUSCULAR | Status: DC

## 2019-04-22 MED ORDER — GUAIFENESIN-DM 100-10 MG/5ML PO SYRP
5.0000 mL | ORAL_SOLUTION | ORAL | Status: DC | PRN
  Administered 2019-04-25: 07:00:00 5 mL
  Filled 2019-04-22: qty 5

## 2019-04-22 MED ORDER — VITAL AF 1.2 CAL PO LIQD
1000.0000 mL | ORAL | Status: DC
  Administered 2019-04-22 – 2019-04-25 (×4): 1000 mL

## 2019-04-22 MED ORDER — FOLIC ACID 1 MG PO TABS
1.0000 mg | ORAL_TABLET | Freq: Every day | ORAL | Status: DC
  Administered 2019-04-23 – 2019-04-27 (×5): 1 mg
  Filled 2019-04-22 (×5): qty 1

## 2019-04-22 MED ORDER — IBUPROFEN 100 MG/5ML PO SUSP
600.0000 mg | Freq: Three times a day (TID) | ORAL | Status: DC | PRN
  Administered 2019-04-22 – 2019-04-23 (×2): 600 mg
  Filled 2019-04-22 (×3): qty 30

## 2019-04-22 MED ORDER — AMIODARONE HCL 200 MG PO TABS: 200.0000 mg | ORAL_TABLET | Freq: Every day | ORAL | Status: DC

## 2019-04-22 MED ORDER — THIAMINE HCL 100 MG/ML IJ SOLN: 100.0000 mg | Freq: Every day | INTRAMUSCULAR | Status: DC

## 2019-04-22 MED ORDER — PAROXETINE HCL 20 MG PO TABS
20.0000 mg | ORAL_TABLET | Freq: Every day | ORAL | Status: DC
  Administered 2019-04-23 – 2019-04-24 (×2): 20 mg
  Filled 2019-04-22 (×4): qty 1

## 2019-04-22 MED ORDER — CHLORHEXIDINE GLUCONATE 0.12 % MT SOLN: 15.0000 mL | Freq: Two times a day (BID) | OROMUCOSAL | Status: DC

## 2019-04-22 MED ORDER — METOPROLOL TARTRATE 5 MG/5ML IV SOLN
2.5000 mg | Freq: Four times a day (QID) | INTRAVENOUS | Status: DC | PRN
  Administered 2019-04-22 – 2019-04-25 (×6): 2.5 mg via INTRAVENOUS
  Filled 2019-04-22 (×6): qty 5

## 2019-04-22 MED ORDER — FREE WATER
250.0000 mL | Freq: Three times a day (TID) | Status: DC
  Administered 2019-04-22 – 2019-04-23 (×3): 250 mL

## 2019-04-22 MED ORDER — HYDRALAZINE HCL 20 MG/ML IJ SOLN: 10.0000 mg | Freq: Four times a day (QID) | INTRAMUSCULAR | Status: DC | PRN

## 2019-04-22 MED ORDER — MIRTAZAPINE 15 MG PO TBDP
15.0000 mg | ORAL_TABLET | Freq: Every day | ORAL | Status: DC
  Administered 2019-04-22 – 2019-04-24 (×3): 15 mg
  Filled 2019-04-22 (×4): qty 1

## 2019-04-22 MED ORDER — MIDAZOLAM BOLUS VIA INFUSION
1.0000 mg | INTRAVENOUS | Status: DC | PRN
  Administered 2019-04-23 (×3): 2 mg via INTRAVENOUS
  Administered 2019-04-24: 1 mg via INTRAVENOUS
  Administered 2019-04-25 – 2019-04-27 (×5): 2 mg via INTRAVENOUS
  Filled 2019-04-22: qty 2

## 2019-04-22 MED ORDER — INSULIN ASPART 100 UNIT/ML ~~LOC~~ SOLN
0.0000 [IU] | SUBCUTANEOUS | Status: DC
  Administered 2019-04-22 – 2019-04-23 (×6): 2 [IU] via SUBCUTANEOUS
  Administered 2019-04-24 (×4): 3 [IU] via SUBCUTANEOUS
  Administered 2019-04-24: 2 [IU] via SUBCUTANEOUS
  Administered 2019-04-24: 3 [IU] via SUBCUTANEOUS
  Administered 2019-04-25 (×2): 2 [IU] via SUBCUTANEOUS
  Administered 2019-04-25 (×3): 3 [IU] via SUBCUTANEOUS
  Administered 2019-04-26: 2 [IU] via SUBCUTANEOUS
  Administered 2019-04-26: 3 [IU] via SUBCUTANEOUS
  Administered 2019-04-26: 2 [IU] via SUBCUTANEOUS
  Administered 2019-04-26: 3 [IU] via SUBCUTANEOUS
  Administered 2019-04-27: 2 [IU] via SUBCUTANEOUS

## 2019-04-22 MED ORDER — AMIODARONE LOAD VIA INFUSION
150.0000 mg | Freq: Once | INTRAVENOUS | Status: AC
  Administered 2019-04-22: 150 mg via INTRAVENOUS
  Filled 2019-04-22: qty 83.34

## 2019-04-22 MED FILL — Sodium Chloride IV Soln 0.9%: INTRAVENOUS | Qty: 250 | Status: AC

## 2019-04-22 MED FILL — Fentanyl Citrate Preservative Free (PF) Inj 2500 MCG/50ML: INTRAMUSCULAR | Qty: 50 | Status: AC

## 2019-04-22 NOTE — Progress Notes (Signed)
eLink Physician-Brief Progress Note Patient Name: Spencer Richard DOB: 09-09-1960 MRN: [id number]   Date of Service  04/16/2019  HPI/Events of Note  A fib in 140s/150s with stable BP. RN had increased sedation with fentanyl due to severe vent dys-synchrony  eICU Interventions  Seen on camera. Agitated and uncomfortable. Is on amiodarone infusion Ordered PRN metoprolol Ordered versed infusion as ativan pushes did not help and already on high dose fentanyl and RN notes precedex dropped his HR too much      Intervention Category Major Interventions: Arrhythmia - evaluation and management  Margaretmary Lombard 04/03/2019, 9:51 PM

## 2019-04-22 NOTE — Progress Notes (Addendum)
Sent on 06/22/22 for review, unclear reason

## 2019-04-22 NOTE — Progress Notes (Signed)
Nutrition Follow-up RD working remotely.  DOCUMENTATION CODES:   Not applicable  INTERVENTION:   To better meet re-estimated needs, change TF regimen:   Vital AF 1.2 at 75 ml/h (1800 ml per day)   D/C Pro-stat   Provides 2160 kcal, 135 gm protein, 1460 ml free water daily  NUTRITION DIAGNOSIS:   Increased nutrient needs related to acute illness(COVID 19) as evidenced by estimated needs.  Ongoing  GOAL:   Patient will meet greater than or equal to 90% of their needs   Met with TF  MONITOR:   TF tolerance, Labs, Vent status  ASSESSMENT:   58 yo male admitted with acute hypoxic respiratory failure r/t COVID 19 PNA and ETOH withdrawal. PMH includes ETOH abuse, HTN.   Patient remains intubated on ventilator support MV: 9.7 L/min Temp (24hrs), Avg:100.6 F (38.1 C), Min:99.9 F (37.7 C), Max:102 F (38.9 C)   Labs reviewed. Sodium 150 (H), BUN 91 (H), creatinine 2.33 (H), magnesium 2.9 (H) CBG's: [id number]  Medications reviewed and include folic acid, novolog, solumedrol, remeron, MVI, thiamine, neosynephrine.  Patient is currently receiving Vital High Protein via OGT at 50 ml/h (1200 ml/day) with Prostat 30 ml TID to provide 1500 kcals, 150 gm protein, 1003 ml free water daily. Free water 250 ml every 8 hours. Tolerating TF well.   Admission weight 95.6 kg, down to 82 kg today. I/O +3.7 L   Diet Order:   Diet Order            Diet NPO time specified  Diet effective now              EDUCATION NEEDS:   Not appropriate for education at this time  Skin:  Skin Assessment: Reviewed RN Assessment  Last BM:  11/22 type 6  Height:   Ht Readings from Last 1 Encounters:  04/29/2019 '5\' 8"'  (1.727 m)    Weight:   Wt Readings from Last 1 Encounters:  04/02/2019 82 kg    Ideal Body Weight:  70 kg  BMI:  Body mass index is 27.49 kg/m.  Patient no longer meets criteria for obesity.  Estimated Nutritional Needs:   Kcal:  2190  Protein:  125-145  gm  Fluid:  >/= 2.1 L    Molli Barrows, RD, LDN, [facility] Pager [phone] After Hours Pager [phone]

## 2019-04-22 NOTE — Progress Notes (Addendum)
Pharmacy Antibiotic Note  Spencer Richard is a 58 y.o. male admitted on 04/05/2019 with pneumonia.  Pharmacy managing Cefepime dosing. Leukocytosis continues to improve. Now afebrile. SCr has risen acutely to 2.33.   Plan: Decrease Cefepime to 2 gm IV Q 12 hours  Follow up renal function, culture results, and clinical course.  Height: 5\' 8"  (172.7 cm) Weight: 180 lb 12.4 oz (82 kg)(could not get bed scale to work) IBW/kg (Calculated) : 68.4  Temp (24hrs), Avg:100.6 F (38.1 C), Min:99.9 F (37.7 C), Max:102 F (38.9 C)  Recent Labs  Lab 04/18/19 0427 04/19/19 0337 04/20/19 0330 04/20/19 2120 04/21/19 0502 05/18/19 0455  WBC 11.5* 10.6* 19.3*  --  31.2* 21.2*  CREATININE 0.86 0.77 1.30* 1.51* 1.17 2.33*    Estimated Creatinine Clearance: 37.9 mL/min (A) (by C-G formula based on SCr of 2.33 mg/dL (H)).    No Known Allergies  Antimicrobials this admission: 11/15 Remdesivir >> 11/19 11/16 Ceftriaxone/Azith >> 11/20 11/21 Cefepime >>   Dose adjustments this admission:   Microbiology results: 11/15 BCx: negF  11/15 SARS coronavirus 2: positive 11/15 MRSA PCR: neg 11/21 BCx: ngtd  11/21 Resp Cxt: Klebsiella pneumoniae  Thank you for allowing pharmacy to be a part of this patient's care.  Albertina Parr, PharmD., BCPS Clinical Pharmacist Clinical phone for 05/18/2019 until 5pm: [phone]

## 2019-04-22 NOTE — Progress Notes (Incomplete Revision)
NAMEKatai Richard MRN:  [id number] DOB:  1960-08-29, LOS: 8 ADMISSION DATE:  May 03, 2019, CONSULTATION DATE:  11/17 REFERRING MD:  TRH, CHIEF COMPLAINT:  dyspnea    Brief History   58 y/o alcoholic male with COVID 19 pneumonia, intubated in setting of encephalopathy and respiratory failure on 11/17.     Past Medical History  HTN   Significant Hospital Events   11/15-Admit 11/17- Intubation for respiratory failure, severe bradycardia on precedex requiring atropine 11/21- Persistent fevers, Rapid afib, started amio 11/22- Converted to NSR   Consults:  PCCM   Procedures:  ETT 11/17 >  R IJ CVL 11/21>    Significant Diagnostic Tests:      Micro Data:  COVID 19 positive Blood cx 11/15>>>NTD   Antimicrobials:  Remdisivir 11/15>>> 11/19 Rocephin 11/15>>> 11/20 Zithromax 11/16>>> 11/20  Cefepime 11/21 >> Decadron 11/16 >>   Interim history/subjective:  Remains in sinus rhythm Heavily sedated   Objective   Blood pressure 111/70, pulse 77, temperature 99.9 F (37.7 C), resp. rate (!) 0, height 5\' 8"  (1.727 m), weight 82 kg, SpO2 99 %.     >  Vent Mode: PCV FiO2 (%):  [40 %] 40 % Set Rate:  [30 bmp] 30 bmp PEEP:  [5 cmH20] 5 cmH20 Plateau Pressure:  [16 cmH20-27 cmH20] 21 cmH20      Intake/Output Summary (Last 24 hours) at 04/13/2019 1038 Last data filed at 04/06/2019 0830    Gross per 24 hour  Intake 4413.85 ml  Output 1815 ml  Net 2598.85 ml         Filed Weights    04/20/19 0500 04/21/19 0350 04/20/2019 0500  Weight: 81.2 kg 82 kg 82 kg      Examination:   General:  In bed on vent HENT: NCAT ETT in place PULM: CTA B, vent supported breathing CV: RRR, no mgr GI: BS+, soft, nontender MSK: normal bulk and tone Neuro: sedated on vent   11/22 CXR persistent bilateral infiltrates, ETT in place cardiomgally   Resolved Hospital Problem list       Assessment & Plan:  ARDS: improving Pressure support ventilation as long as tolerated today VAP  prevention Wean off sedation   Rapid atrial fibrillation> resolved Stop metoprolol given mild hypotension Change amiodarone to oral   hypotention due to sedation Stop metoprolol Wean off neosynephrine for MAP > 65   Pneumonia: HCAP? Continue cefepime for 5 days   EtOH withdrawal: over sedated at this point Wean ativan scheduled dosing Wean off fentanyl infusion Consider change to precedex on 11/24   Elevated blood sugar SSI   FEN Tube feeding   Best practice:  Diet: tube feeding Pain/Anxiety/Delirium protocol (if indicated): as above VAP protocol (if indicated): yes DVT prophylaxis: lovenox GI prophylaxis: famotidine Glucose control: ssi Mobility: bed rest Code Status: full Family Communication: will update on 11/23 if we can contact them Disposition: remain in ICU   Labs   CBC: Last Labs               Recent Labs  Lab 04/16/19 0750   04/17/19 0542   04/18/19 0427 04/19/19 0337 04/20/19 0330 04/21/19 0502 04/02/2019 0415 04/29/2019 0455  WBC 11.5*  --  10.5  --  11.5* 10.6* 19.3* 31.2*  --  21.2*  NEUTROABS 9.9*  --  8.3*  --  9.3* 8.6* 16.8*  --   --   --   HGB 11.1*   < > 10.7*   < >  10.9* 10.4* 12.3* 12.0* 11.2* 9.7*  HCT 40.3   < > 37.5*   < > 39.2 37.1* 44.5 42.8 33.0* 33.9*  MCV 73.9*  --  73.0*  --  74.1* 73.0* 73.7* 74.0*  --  72.9*  PLT 478*  --  430*  --  503* 531* 631* 716*  --  570*   < > = values in this interval not displayed.        Basic Metabolic Panel: Last Labs              Recent Labs  Lab 04/17/19 0542   04/18/19 0427 04/19/19 0337 04/20/19 0330 04/20/19 2120 04/21/19 0502 04/13/2019 0415 04/02/2019 0455  NA 143   < > 147* 149* 149* 148* 146* 148* 150*  K 4.1   < > 4.2 4.3 3.9 4.6 4.2 4.4 4.4  CL 101  --  104 108 107 107 108  --  114*  CO2 30  --  33* 31 29 30 28   --  26  GLUCOSE 120*  --  148* 153* 228* 167* 181*  --  142*  BUN 60*  --  45* 40* 50* 64* 66*  --  91*  CREATININE 1.85*  --  0.86 0.77 1.30* 1.51* 1.17  --   2.33*  CALCIUM 8.5*  --  8.6* 8.6* 8.6* 8.3* 8.3*  --  8.0*  MG 2.4  --  2.5* 2.3  --  3.0* 3.0*  --  2.9*  PHOS 3.2  --  3.5 3.2  --   --  4.2  --  4.3   < > = values in this interval not displayed.      GFR: Estimated Creatinine Clearance: 37.9 mL/min (A) (by C-G formula based on SCr of 2.33 mg/dL (H)). Last Labs         Recent Labs  Lab 04/19/19 0337 04/20/19 0330 04/21/19 0502 04/24/2019 0455  PROCALCITON  --  1.02 0.82  --   WBC 10.6* 19.3* 31.2* 21.2*        Liver Function Tests: Last Labs          Recent Labs  Lab 04/16/19 0750 04/17/19 0542 04/18/19 0427 04/19/19 0337 04/20/19 0330  AST 34 29 38 36 44*  ALT 34 26 27 28  34  ALKPHOS 94 82 91 90 107  BILITOT 0.8 0.4 0.4 0.7 0.8  PROT 7.4 6.9 7.2 6.7 7.5  ALBUMIN 3.1* 2.8* 2.7* 2.5* 2.6*      Last Labs   No results for input(s): LIPASE, AMYLASE in the last 168 hours.   Last Labs   No results for input(s): AMMONIA in the last 168 hours.     ABG Labs (Brief)          Component Value Date/Time    PHART 7.326 (L) 04/03/2019 0415    PCO2ART 52.1 (H) 04/13/2019 0415    PO2ART 82.0 (L) 04/01/2019 0415    HCO3 26.9 04/24/2019 0415    TCO2 28 04/01/2019 0415    O2SAT 94.0 04/17/2019 0415        Coagulation Profile: Last Labs   No results for input(s): INR, PROTIME in the last 168 hours.     Cardiac Enzymes: Last Labs   No results for input(s): CKTOTAL, CKMB, CKMBINDEX, TROPONINI in the last 168 hours.     HbA1C: Last Labs         Hgb A1c MFr Bld  Date/Time Value Ref Range Status  04/20/2019 03:30 AM 6.5 (H) 4.8 -  5.6 % Final      Comment:      (NOTE) Pre diabetes:          5.7%-6.4% Diabetes:              >6.4% Glycemic control for   <7.0% adults with diabetes          CBG: Last Labs          Recent Labs  Lab 04/21/19 1717 04/21/19 2024 05/18/19 0017 05/18/19 0338 2019-05-18 0858  GLUCAP 157* 136* 169* 149* 141*          Critical care time: 35 minutes        Roselie Awkward, MD [facility] [facility] Pager: [phone] Cell: [phone] If no response, call [phone]                      Record: [id number] Message: Clinical Note

## 2019-04-22 NOTE — Progress Notes (Signed)
Patient went back into afib, called and restarted amio with bolus dose. Being given now

## 2019-04-22 NOTE — Plan of Care (Signed)

## 2019-04-22 NOTE — Progress Notes (Signed)
   04/12/2019 0530  Vitals  Temp (!) 100.8 F (38.2 C)   Advil administered per order.

## 2019-04-22 NOTE — Progress Notes (Addendum)
Given 2.5 mg metoprolol with good effect. Pt reverted to NSR. Started Versed drip, pt more synchronus with the vent. All vital signs still crossing over to [employer]. Central telemetry made aware but they said nothing they can do on their end. All vital signs for [facility] were verified on [place] [facility].

## 2019-04-22 NOTE — Progress Notes (Signed)
Airway entered to original date and time.  VT adjusted to the correct patient height.

## 2019-04-23 ENCOUNTER — Inpatient Hospital Stay (HOSPITAL_COMMUNITY): Payer: HRSA Program

## 2019-04-23 LAB — HEMOGLOBIN A1C
Hgb A1c MFr Bld: 6.7 % — ABNORMAL HIGH (ref 4.8–5.6)
Mean Plasma Glucose: 145.59 mg/dL

## 2019-04-23 LAB — POCT I-STAT 7, (LYTES, BLD GAS, ICA,H+H)
Acid-base deficit: 3 mmol/L — ABNORMAL HIGH (ref 0.0–2.0)
Bicarbonate: 25.4 mmol/L (ref 20.0–28.0)
Calcium, Ion: 1.15 mmol/L (ref 1.15–1.40)
HCT: 29 % — ABNORMAL LOW (ref 39.0–52.0)
Hemoglobin: 9.9 g/dL — ABNORMAL LOW (ref 13.0–17.0)
O2 Saturation: 95 %
Patient temperature: 37.7
Potassium: 4.7 mmol/L (ref 3.5–5.1)
Sodium: 149 mmol/L — ABNORMAL HIGH (ref 135–145)
TCO2: 27 mmol/L (ref 22–32)
pCO2 arterial: 62.5 mmHg — ABNORMAL HIGH (ref 32.0–48.0)
pH, Arterial: 7.221 — ABNORMAL LOW (ref 7.350–7.450)
pO2, Arterial: 94 mmHg (ref 83.0–108.0)

## 2019-04-23 LAB — GLUCOSE, CAPILLARY
Glucose-Capillary: 131 mg/dL — ABNORMAL HIGH (ref 70–99)
Glucose-Capillary: 140 mg/dL — ABNORMAL HIGH (ref 70–99)
Glucose-Capillary: 122 mg/dL — ABNORMAL HIGH (ref 70–99)

## 2019-04-23 LAB — RENAL FUNCTION PANEL
Albumin: 1.8 g/dL — ABNORMAL LOW (ref 3.5–5.0)
Anion gap: 14 (ref 5–15)
BUN: 136 mg/dL — ABNORMAL HIGH (ref 6–20)
CO2: 23 mmol/L (ref 22–32)
Calcium: 7.3 mg/dL — ABNORMAL LOW (ref 8.9–10.3)
Chloride: 114 mmol/L — ABNORMAL HIGH (ref 98–111)
Creatinine, Ser: 3.71 mg/dL — ABNORMAL HIGH (ref 0.61–1.24)
GFR calc Af Amer: 19 mL/min — ABNORMAL LOW (ref >60)
GFR calc non Af Amer: 17 mL/min — ABNORMAL LOW (ref >60)
Glucose, Bld: 114 mg/dL — ABNORMAL HIGH (ref 70–99)
Phosphorus: 5.8 mg/dL — ABNORMAL HIGH (ref 2.5–4.6)
Potassium: 4.7 mmol/L (ref 3.5–5.1)
Sodium: 151 mmol/L — ABNORMAL HIGH (ref 135–145)

## 2019-04-23 LAB — MAGNESIUM: Magnesium: 3.2 mg/dL — ABNORMAL HIGH (ref 1.7–2.4)

## 2019-04-23 LAB — BASIC METABOLIC PANEL
Anion gap: 13 (ref 5–15)
BUN: 107 mg/dL — ABNORMAL HIGH (ref 6–20)
CO2: 25 mmol/L (ref 22–32)
Calcium: 8 mg/dL — ABNORMAL LOW (ref 8.9–10.3)
Chloride: 115 mmol/L — ABNORMAL HIGH (ref 98–111)
Creatinine, Ser: 3.1 mg/dL — ABNORMAL HIGH (ref 0.61–1.24)
GFR calc Af Amer: 24 mL/min — ABNORMAL LOW (ref >60)
GFR calc non Af Amer: 21 mL/min — ABNORMAL LOW (ref >60)
Glucose, Bld: 134 mg/dL — ABNORMAL HIGH (ref 70–99)
Potassium: 5 mmol/L (ref 3.5–5.1)
Sodium: 153 mmol/L — ABNORMAL HIGH (ref 135–145)

## 2019-04-23 LAB — LACTIC ACID, PLASMA
Lactic Acid, Venous: 1.6 mmol/L (ref 0.5–1.9)
Lactic Acid, Venous: 1.8 mmol/L (ref 0.5–1.9)

## 2019-04-23 LAB — HIV ANTIBODY (ROUTINE TESTING W REFLEX): HIV Screen 4th Generation wRfx: NONREACTIVE

## 2019-04-23 MED ORDER — SODIUM CHLORIDE 0.9 % IV SOLN
2.0000 g | Freq: Once | INTRAVENOUS | Status: AC
  Administered 2019-04-23: 2 g via INTRAVENOUS
  Filled 2019-04-23: qty 2

## 2019-04-23 MED ORDER — VANCOMYCIN HCL IN DEXTROSE 1-5 GM/200ML-% IV SOLN: 1000.0000 mg | INTRAVENOUS | Status: DC

## 2019-04-23 MED ORDER — LACTATED RINGERS IV BOLUS
1000.0000 mL | Freq: Once | INTRAVENOUS | Status: AC
  Administered 2019-04-23: 1000 mL via INTRAVENOUS

## 2019-04-23 MED ORDER — THIAMINE HCL 100 MG/ML IJ SOLN
100.0000 mg | Freq: Every day | INTRAMUSCULAR | Status: DC
  Filled 2019-04-23: qty 2

## 2019-04-23 MED ORDER — VITAMIN B-1 100 MG PO TABS
100.0000 mg | ORAL_TABLET | Freq: Every day | ORAL | Status: DC
  Administered 2019-04-23 – 2019-04-27 (×5): 100 mg
  Filled 2019-04-23 (×5): qty 1

## 2019-04-23 MED ORDER — FREE WATER
300.0000 mL | Freq: Three times a day (TID) | Status: DC
  Administered 2019-04-23 – 2019-04-27 (×11): 300 mL

## 2019-04-23 MED ORDER — PANTOPRAZOLE SODIUM 40 MG PO PACK
40.0000 mg | PACK | Freq: Every day | ORAL | Status: DC
  Administered 2019-04-23 – 2019-04-27 (×5): 40 mg
  Filled 2019-04-23 (×5): qty 20

## 2019-04-23 MED ORDER — HEPARIN SODIUM (PORCINE) 10000 UNIT/ML IJ SOLN
7500.0000 [IU] | Freq: Three times a day (TID) | INTRAMUSCULAR | Status: DC
  Administered 2019-04-23 – 2019-04-26 (×9): 7500 [IU] via SUBCUTANEOUS
  Filled 2019-04-23 (×9): qty 1

## 2019-04-23 MED ORDER — SODIUM CHLORIDE 0.9 % IV SOLN: 2.0000 g | INTRAVENOUS | Status: DC

## 2019-04-23 MED ORDER — CHLORHEXIDINE GLUCONATE CLOTH 2 % EX PADS
6.0000 | MEDICATED_PAD | Freq: Every day | CUTANEOUS | Status: DC
  Administered 2019-04-23 – 2019-04-27 (×5): 6 via TOPICAL

## 2019-04-23 MED ORDER — CHLORHEXIDINE GLUCONATE 0.12 % MT SOLN
15.0000 mL | Freq: Two times a day (BID) | OROMUCOSAL | Status: DC
  Administered 2019-04-23 – 2019-04-27 (×8): 15 mL via OROMUCOSAL
  Filled 2019-04-23 (×2): qty 15

## 2019-04-23 MED ORDER — SODIUM CHLORIDE 0.9 % IV SOLN
INTRAVENOUS | Status: DC | PRN
  Administered 2019-04-23: 250 mL via INTRAVENOUS
  Administered 2019-04-25: 1000 mL via INTRAVENOUS
  Administered 2019-04-25 – 2019-04-28 (×5): 250 mL via INTRAVENOUS

## 2019-04-23 MED ORDER — LURASIDONE HCL 40 MG PO TABS
80.0000 mg | ORAL_TABLET | Freq: Every day | ORAL | Status: DC
  Administered 2019-04-23 – 2019-04-24 (×2): 80 mg
  Filled 2019-04-23 (×3): qty 2

## 2019-04-23 MED ORDER — SODIUM CHLORIDE 0.9 % IV SOLN
1.0000 g | Freq: Two times a day (BID) | INTRAVENOUS | Status: DC
  Administered 2019-04-24 – 2019-04-27 (×7): 1 g via INTRAVENOUS
  Filled 2019-04-23 (×8): qty 1

## 2019-04-23 MED ORDER — ADULT MULTIVITAMIN LIQUID CH
15.0000 mL | Freq: Every day | ORAL | Status: DC
  Administered 2019-04-23 – 2019-04-27 (×5): 15 mL
  Filled 2019-04-23 (×5): qty 15

## 2019-04-23 MED ORDER — VANCOMYCIN HCL 10 G IV SOLR
1500.0000 mg | Freq: Once | INTRAVENOUS | Status: AC
  Administered 2019-04-23: 1500 mg via INTRAVENOUS
  Filled 2019-04-23: qty 1500

## 2019-04-23 MED ORDER — FUROSEMIDE 10 MG/ML IJ SOLN
80.0000 mg | Freq: Once | INTRAMUSCULAR | Status: AC
  Administered 2019-04-23: 13:00:00 80 mg via INTRAVENOUS
  Filled 2019-04-23: qty 8

## 2019-04-23 NOTE — Progress Notes (Addendum)
NAME:  Densil Ottey, MRN:  [id number], DOB:  January 29, 1961, LOS: 1 ADMISSION DATE:  04/07/2019, CONSULTATION DATE:  11/17 REFERRING MD:  TRH, CHIEF COMPLAINT:  dyspnea   Brief History   Corrected age and identity of 58 y/o alcoholic male with COVID 19 pneumonia, intubated in setting of encephalopathy and respiratory failure on 11/17. (was identified as [name] [phone] M on admission)  Past Medical History  HTN  Significant Hospital Events   11/15-Admit 11/17- Intubation for respiratory failure, severe bradycardia on precedex requiring atropine 11/21- Persistent fevers, Rapid afib, started amio 11/22- Converted to NSR   Consults:  PCCM  Procedures:  ETT 11/17 >  R IJ CVL 11/21>   Significant Diagnostic Tests:    Micro Data:  COVID 19 positive Blood cx 11/15>>>NTD Tracheal aspirate 11/21>Klebsiella, ampicillin resistant  Antimicrobials:  Remdisivir 11/15>>> 11/19 Rocephin 11/15>>> 11/20 Zithromax 11/16>>> 11/20  Cefepime 11/21 >> Decadron 11/16 >>  Interim history/subjective:  Paroxysmal A. fib overnight, converted to sinus rhythm/sinus tachycardia after beta-blockade. Periods of agitation and vent dyssynchrony.  Versed drip initiated. Worsening creatinine.  Objective   Blood pressure (!) 145/81, pulse (!) 108, temperature 99.7 F (37.6 C), temperature source Esophageal, resp. rate (!) 26, height 5\' 3"  (1.6 m), weight 99.5 kg, SpO2 100 %.    Vent Mode: PRVC FiO2 (%):  [40 %] 40 % Set Rate:  [18 bmp] 18 bmp Vt Set:  [450 mL] 450 mL PEEP:  [5 cmH20] 5 cmH20 Pressure Support:  [10 cmH20] 10 cmH20 Plateau Pressure:  [21 cmH20] 21 cmH20   Intake/Output Summary (Last 24 hours) at 04/23/2019 0955 Last data filed at 04/23/2019 0804 Gross per 24 hour  Intake 1972.05 ml  Output 2275 ml  Net -302.95 ml   Filed Weights   04/23/2019 1700 04/23/19 0425  Weight: 99 kg 99.5 kg    Examination:  General: Acutely ill-appearing, well-developed male, lying in bed HENT:  Normocephalic, PERRL. Moist mucus membranes Neck: No JVD. Trachea midline.  CV: RRR. S1S2. No MRG. +2 distal pulses Lungs: BBS rhonchi throughout, tachypneic, symmetrical, vent supported ABD: +BS x4. SNT/ND. No masses, guarding or rigidity GU: Condom cath in place.  Marked scrotal edema left greater than right, firmness noted to left side EXT: MAE spontaneously generalized edema Skin: flushed. WD. In tact. No rashes or lesions Neuro: Grimaces to noxious stimuli, moves spontaneously, does not follow commands    No imaging today. Previous reviewed.   Resolved Hospital Problem list     Assessment & Plan:  ARDS 2/2 COVID19 Pneumonia: Slowly improving Continue mechanical ventilation per ARDS protocol Target TVol 6-8cc/kgIBW Target Plateau Pressure < 30cm H20 Target driving pressure less than 15 cm of water Target PaO2 55-65: titrate PEEP/FiO2 per protocol As long as PaO2 to FiO2 ratio is less than 1:150 position in prone position for 16 hours a day Ventilator associated pneumonia prevention protocol Required increased sedation overnight with the addition of Versed due to vent dyssynchrony. RASS goal 0 to -1     Paroxysmal rapid atrial fibrillation.  Episodic overnight, responded to beta blockade, back in sinus rhythm this morning. IV amiodarone resumed overnight. Continue amiodarone infusion Continue as needed metoprolol   Hypotention due to sedation-resolved  He is not requiring pressors and has done well with the addition of Versed. Continue MAP goal> 65  ARF. Crea steadily rising despite adequate UO.  He is edematous. I/O accuracy difficult to ascertain due to combined charts.  Was diuresed earlier this week. Also receiving ibuprofen for fever.  Hypernatremia.  5.5 L free water deficit Scrotal edema-possible post obstructive process contributing to worsening renal fxn?  Suspect all of the above may be contributing to worsening acute renal failure. D/c ibuprofen  Change  enoxaparin to heparin Recheck renal panel at [address] for possible retention Increase free water Scrotal ultrasound Trial diuresis Pending above patient may require nephrology consult   Klebsiella pneumonia: Probable HCAP Continue cefepime for 5 days  EtOH withdrawal: Started on Versed overnight due to agitation and vent dyssynchrony Will attempt to decrease this, stop Ativan for now Wean fentanyl infusion as able   Elevated blood sugar with A1c 6.7 Continue SSI  FEN Tube feeding  Best practice:  Diet: tube feeding Pain/Anxiety/Delirium protocol (if indicated): as above VAP protocol (if indicated): yes DVT prophylaxis: Heparin due to worsening renal function GI prophylaxis: PPI Glucose control: SSI  Mobility: bed rest Code Status: FULL  Family Communication: updated nephew Jon BillingsJoseph Hartfell by phone as well as pt's ex -wife "Stanton KidneyDebra" who was with the nephew. She does not want to be listed on contacts  Disposition: remain in ICU  Labs   CBC: Recent Labs  Lab 04/17/19 0542  04/18/19 0427 04/19/19 0337 04/20/19 0330 04/21/19 0502 04/10/2019 0415 04/11/2019 0455  WBC 10.5  --  11.5* 10.6* 19.3* 31.2*  --  21.2*  NEUTROABS 8.3*  --  9.3* 8.6* 16.8*  --   --   --   HGB 10.7*   < > 10.9* 10.4* 12.3* 12.0* 11.2* 9.7*  HCT 37.5*   < > 39.2 37.1* 44.5 42.8 33.0* 33.9*  MCV 73.0*  --  74.1* 73.0* 73.7* 74.0*  --  72.9*  PLT 430*  --  503* 531* 631* 716*  --  570*   < > = values in this interval not displayed.    Basic Metabolic Panel: Recent Labs  Lab 04/17/19 0542  04/18/19 0427 04/19/19 0337 04/20/19 0330 04/20/19 2120 04/21/19 0502 04/12/2019 0415 04/05/2019 0455 04/23/19 0350  NA 143   < > 147* 149* 149* 148* 146* 148* 150* 153*  K 4.1   < > 4.2 4.3 3.9 4.6 4.2 4.4 4.4 5.0  CL 101  --  104 108 107 107 108  --  114* 115*  CO2 30  --  33* 31 29 30 28   --  26 25  GLUCOSE 120*  --  148* 153* 228* 167* 181*  --  142* 134*  BUN 60*  --  45* 40* 50* 64* 66*  --   91* 107*  CREATININE 1.85*  --  0.86 0.77 1.30* 1.51* 1.17  --  2.33* 3.10*  CALCIUM 8.5*  --  8.6* 8.6* 8.6* 8.3* 8.3*  --  8.0* 8.0*  MG 2.4  --  2.5* 2.3  --  3.0* 3.0*  --  2.9*  --   PHOS 3.2  --  3.5 3.2  --   --  4.2  --  4.3  --    < > = values in this interval not displayed.   GFR: Estimated Creatinine Clearance: 26.8 mL/min (A) (by C-G formula based on SCr of 3.1 mg/dL (H)). Recent Labs  Lab 04/19/19 0337 04/20/19 0330 04/21/19 0502 04/09/2019 0455  PROCALCITON  --  1.02 0.82  --   WBC 10.6* 19.3* 31.2* 21.2*    Liver Function Tests: Recent Labs  Lab 04/17/19 0542 04/18/19 0427 04/19/19 0337 04/20/19 0330  AST 29 38 36 44*  ALT 26 27 28  34  ALKPHOS 82 91  90 107  BILITOT 0.4 0.4 0.7 0.8  PROT 6.9 7.2 6.7 7.5  ALBUMIN 2.8* 2.7* 2.5* 2.6*   No results for input(s): LIPASE, AMYLASE in the last 168 hours. No results for input(s): AMMONIA in the last 168 hours.  ABG    Component Value Date/Time   PHART 7.326 (L) 04/04/2019 0415   PCO2ART 52.1 (H) 04/18/2019 0415   PO2ART 82.0 (L) 04/02/2019 0415   HCO3 26.9 04/13/2019 0415   TCO2 28 04/24/2019 0415   O2SAT 94.0 03/31/2019 0415     Coagulation Profile: No results for input(s): INR, PROTIME in the last 168 hours.  Cardiac Enzymes: No results for input(s): CKTOTAL, CKMB, CKMBINDEX, TROPONINI in the last 168 hours.  HbA1C: Hgb A1c MFr Bld  Date/Time Value Ref Range Status  04/23/2019 03:50 AM 6.7 (H) 4.8 - 5.6 % Final    Comment:    (NOTE) Pre diabetes:          5.7%-6.4% Diabetes:              >6.4% Glycemic control for   <7.0% adults with diabetes   04/20/2019 03:30 AM 6.5 (H) 4.8 - 5.6 % Final    Comment:    (NOTE) Pre diabetes:          5.7%-6.4% Diabetes:              >6.4% Glycemic control for   <7.0% adults with diabetes     CBG: Recent Labs  Lab 04/25/2019 1538 04/05/2019 2005 04/10/2019 2322 04/23/19 0406 04/23/19 0813  GLUCAP 149* 133* 137* 131* 140*       The patient is  critically ill with respiratory failure. He requires ICU for high complexity decision making, titration of high alert medications, ventilator management, titration of oxygen and interpretation of advanced monitoring.    I personally spent 60 minutes providing critical care services including personally reviewing test results, discussing care with nursing staff/other physicians and completing orders pertaining to this patient.  Time was exclusive to the patient and does not include time spent teaching or in procedures.  Voice recognition software was used in the production of this record.  Errors in interpretation may have been inadvertently missed during review.  Francine Graven, MSN, AGACNP  [facility]

## 2019-04-23 NOTE — Plan of Care (Signed)
Fentanyl and midazolam titrated as per RAAS. Noticed scrotal edema getting worse. Reverted to NSR this shift after 2.5 mg metoprolol. Amodarone infusion continues. Problem: Clinical Measurements: Goal: Ability to maintain clinical measurements within normal limits will improve 04/23/2019 0659 by Val Eagle, RN Outcome: Progressing 05/03/19 2048 by Val Eagle, RN Outcome: Progressing Goal: Will remain free from infection 04/23/2019 0659 by Val Eagle, RN Outcome: Progressing 05-03-2019 2048 by Val Eagle, RN Outcome: Progressing Goal: Diagnostic test results will improve 04/23/2019 0659 by Val Eagle, RN Outcome: Progressing May 03, 2019 2048 by Val Eagle, RN Outcome: Progressing Goal: Respiratory complications will improve 04/23/2019 0659 by Val Eagle, RN Outcome: Progressing 05/03/19 2048 by Val Eagle, RN Outcome: Progressing Goal: Cardiovascular complication will be avoided 04/23/2019 0659 by Val Eagle, RN Outcome: Progressing 05-03-19 2048 by Val Eagle, RN Outcome: Progressing   Problem: Activity: Goal: Risk for activity intolerance will decrease 04/23/2019 0659 by Val Eagle, RN Outcome: Progressing 05-03-2019 2048 by Val Eagle, RN Outcome: Progressing   Problem: Nutrition: Goal: Adequate nutrition will be maintained 04/23/2019 0659 by Val Eagle, RN Outcome: Progressing 05/03/19 2048 by Val Eagle, RN Outcome: Progressing   Problem: Elimination: Goal: Will not experience complications related to bowel motility 04/23/2019 0659 by Val Eagle, RN Outcome: Progressing 05/03/2019 2048 by Val Eagle, RN Outcome: Progressing Goal: Will not experience complications related to urinary retention 04/23/2019 0659 by Val Eagle, RN Outcome: Progressing 2019-05-03 2048 by Val Eagle,  RN Outcome: Progressing   Problem: Pain Managment: Goal: General experience of comfort will improve 04/23/2019 0659 by Val Eagle, RN Outcome: Progressing 05-03-2019 2048 by Val Eagle, RN Outcome: Progressing   Problem: Safety: Goal: Ability to remain free from injury will improve 04/23/2019 0659 by Val Eagle, RN Outcome: Progressing 2019-05-03 2048 by Val Eagle, RN Outcome: Progressing   Problem: Skin Integrity: Goal: Risk for impaired skin integrity will decrease 04/23/2019 0659 by Val Eagle, RN Outcome: Progressing 05/03/19 2048 by Val Eagle, RN Outcome: Progressing

## 2019-04-23 NOTE — Progress Notes (Addendum)
Notified by radiology scrotal ultrasound consistent with Fournier's gangrene.  Discussed the case with Dr. Tresa Moore, urology on-call.  He will come see the patient.  In the meantime antibiotics will be broadened out to meropenem and vancomycin. Repeat renal panel is still pending.  Will give IV fluid bolus now, check serial lactic acid.  I spent 30 minutes in additional direct patient care including reviewing data,  discussing with other providers, assessment, planning and stabilization and documentation. Time is exclusive to this patient and does not include procedures.

## 2019-04-23 NOTE — Progress Notes (Signed)
eLink Physician-Brief Progress Note Patient Name: Dahlton Hinde DOB: 1961-05-27 MRN: [id number]   Date of Service  04/23/2019  HPI/Events of Note  Bilateral lower extremities cool distally although pulses are present.  eICU Interventions  Bilateral lower extremity arterial dopplers ordered to r/o arterial vascular occlusion.        Kerry Kass Ogan 04/23/2019, 11:49 PM

## 2019-04-23 NOTE — Consult Note (Signed)
Reason for Consult: Severe Epididymorchitis  Referring Physician: Curley Spice MD  Spencer Richard is an 58 y.o. male.   HPI:   1 - Severe Epididymorchitis / Gas-Forming - new scrotal swelling noted during admission for severe COVID19 infection. [place] and exam with L>R epidiymorchitis with very mild crepitus. No necrotizing infection or skin source. Has had foley in for some time. A1c 6s.  BCX 11/21 NGTD. Added Merrem + Vanc.   2 - Acute Renal Failure - Cr 3's up from baseline <1.5. Severe multisource infections. No recent renal imaging. Has been hypotensive.   PMH unknown. Pt's nephew at [phone] is Management consultant.   Today "Bartley" is seen in consultation for above. He is intubated with severe C19 complicated by klebsiella pneumonia.   No past medical history on file.   No family history on file.  Social History:  has no history on file for tobacco, alcohol, and drug.  Allergies: No Known Allergies  Medications: I have reviewed the patient's current medications.  Results for orders placed or performed during the hospital encounter of 05/01/19 (from the past 48 hour(s))  Glucose, capillary     Status: Abnormal   Collection Time: 2019/05/01  8:05 PM  Result Value Ref Range   Glucose-Capillary 133 (H) 70 - 99 mg/dL  Glucose, capillary     Status: Abnormal   Collection Time: 05/01/2019 11:22 PM  Result Value Ref Range   Glucose-Capillary 137 (H) 70 - 99 mg/dL  HIV Antibody (routine testing w rflx)     Status: None   Collection Time: 04/23/19  3:50 AM  Result Value Ref Range   HIV Screen 4th Generation wRfx NON REACTIVE NON REACTIVE    Comment: Performed at [facility], [address], [place], [place] [address]  Hemoglobin A1c     Status: Abnormal   Collection Time: 04/23/19  3:50 AM  Result Value Ref Range   Hgb A1c MFr Bld 6.7 (H) 4.8 - 5.6 %    Comment: (NOTE) Pre diabetes:          5.7%-6.4% Diabetes:              >6.4% Glycemic control for   <7.0% adults with diabetes     Mean Plasma Glucose 145.59 mg/dL    Comment: Performed at [facility], [address], [place], [place] [address]  Basic metabolic panel     Status: Abnormal   Collection Time: 04/23/19  3:50 AM  Result Value Ref Range   Sodium 153 (H) 135 - 145 mmol/L   Potassium 5.0 3.5 - 5.1 mmol/L   Chloride 115 (H) 98 - 111 mmol/L   CO2 25 22 - 32 mmol/L   Glucose, Bld 134 (H) 70 - 99 mg/dL   BUN 465 (H) 6 - 20 mg/dL    Comment: RESULTS CONFIRMED BY MANUAL DILUTION   Creatinine, Ser 3.10 (H) 0.61 - 1.24 mg/dL   Calcium 8.0 (L) 8.9 - 10.3 mg/dL   GFR calc non Af Amer 21 (L) >60 mL/min   GFR calc Af Amer 24 (L) >60 mL/min   Anion gap 13 5 - 15    Comment: Performed at [facility], [address] [place], [place] [address]  Glucose, capillary     Status: Abnormal   Collection Time: 04/23/19  4:06 AM  Result Value Ref Range   Glucose-Capillary 131 (H) 70 - 99 mg/dL  Magnesium     Status: Abnormal   Collection Time: 04/23/19  4:14 AM  Result Value Ref Range   Magnesium 3.2 (H) 1.7 - 2.4 mg/dL    Comment: Performed at [facility], [address] [place], [place] [address]  Glucose, capillary     Status: Abnormal   Collection Time: 04/23/19  8:13 AM  Result Value Ref Range   Glucose-Capillary 140 (H) 70 - 99 mg/dL  I-STAT 7, (LYTES, BLD GAS, ICA, H+H)     Status: Abnormal   Collection Time: 04/23/19 11:21 AM  Result Value Ref Range   pH, Arterial 7.221 (L) 7.350 - 7.450   pCO2 arterial 62.5 (H) 32.0 - 48.0 mmHg   pO2, Arterial 94.0 83.0 - 108.0 mmHg   Bicarbonate 25.4 20.0 - 28.0 mmol/L   TCO2 27 22 - 32 mmol/L   O2 Saturation 95.0 %   Acid-base deficit 3.0 (H) 0.0 - 2.0 mmol/L   Sodium 149 (H) 135 - 145 mmol/L   Potassium 4.7 3.5 - 5.1 mmol/L   Calcium, Ion 1.15 1.15 - 1.40 mmol/L   HCT 29.0 (L) 39.0 - 52.0 %   Hemoglobin 9.9 (L) 13.0 - 17.0 g/dL   Patient temperature 60.437.7 C    Collection site RADIAL, ALLEN'S TEST ACCEPTABLE    Drawn  by RT    Sample type ARTERIAL   Glucose, capillary     Status: Abnormal   Collection Time: 04/23/19 12:42 PM  Result Value Ref Range   Glucose-Capillary 122 (H) 70 - 99 mg/dL  Renal function panel     Status: Abnormal (Preliminary result)   Collection Time: 04/23/19  3:40 PM  Result Value Ref Range   Sodium 151 (H) 135 - 145 mmol/L   Potassium 4.7 3.5 - 5.1 mmol/L   Chloride 114 (H) 98 - 111 mmol/L   CO2 23 22 - 32 mmol/L   Glucose, Bld 114 (H) 70 - 99 mg/dL   BUN PENDING 6 - 20 mg/dL   Creatinine, Ser 5.403.71 (H) 0.61 - 1.24 mg/dL   Calcium 7.3 (L) 8.9 - 10.3 mg/dL   Phosphorus 5.8 (H) 2.5 - 4.6 mg/dL   Albumin 1.8 (L) 3.5 - 5.0 g/dL   GFR calc non Af Amer 17 (L) >60 mL/min   GFR calc Af Amer 19 (L) >60 mL/min   Anion gap 14 5 - 15    Comment: Performed at [facility], [address] [place], [place] [address]  Lactic acid, plasma     Status: None   Collection Time: 04/23/19  5:40 PM  Result Value Ref Range   Lactic Acid, Venous 1.6 0.5 - 1.9 mmol/L    Comment: Performed at [facility], [address] [place], [place] [address]    [place] Scrotum  Result Date: 04/23/2019 CLINICAL DATA:  Scrotal edema EXAM: ULTRASOUND OF SCROTUM TECHNIQUE: Complete ultrasound examination of the testicles, epididymis, and other scrotal structures was performed. COMPARISON:  None FINDINGS: Right testicle Measurements: 3.9 x 1.7 x 2.9 cm.  Normal morphology without mass. Left testicle Measurements: 3.5 x 1.9 x 2.7 cm. Mildly inhomogeneous echogenicity without mass. Right epididymis:  Normal in size and appearance. Left epididymis:  Mildly edematous. Hydrocele:  None visualized. Varicocele: None visualized Other findings: Numerous tiny foci of bright echogenicity throughout the soft tissues in the LEFT hemiscrotum associated with posterior shadowing and soft tissue swelling. Finding is most consistent with numerous foci of gas within the soft tissues and a diagnosis of  Fournier's gangrene. IMPRESSION: Normal appearing testes and epididymi. Soft tissue swelling  in particularly the LEFT hemiscrotum associated with numerous bright echogenic foci which demonstrate posterior acoustic shadowing consistent with gas, representing Fournier's gangrene. Critical Value/emergent results were called by telephone at the time of interpretation on 04/23/2019 at 1658 hrs to Tutwiler NP, who verbally acknowledged these results. Electronically Signed   By: Lavonia Dana M.D.   On: 04/23/2019 17:01    Review of Systems  Unable to perform ROS: Intubated   Blood pressure 109/65, pulse (!) 121, temperature (!) 100.6 F (38.1 C), temperature source Core, resp. rate 17, height 5\' 3"  (1.6 m), weight 99.5 kg, SpO2 98 %. Physical Exam  Constitutional: He appears well-developed.  HENT:  Intubated.   Cardiovascular:  Regular tachycardia low 100s by monitor.   Respiratory:  Not overly course on vent.   GI: Soft.  Genitourinary:    Genitourinary Comments: Left cord and testicular induration and very mild crepitus. Reactive scrotal edema. No necrotizing soft tissue infection of skin or skin breakdown.    Neurological:  GCS 3T    Assessment/Plan:  1 - Severe Epididymorchitis / Gas-Forming - this appears to be testicular source infection (inside out) and not skin source. Not a typical Founiers with skin source in setting of hyperglycemia. Rather this is likely testicular seeding from necessary long term foley. Most likely gram negative gas forming organism. I do NOT recommend surgical debridiement at this time as this appears to be more driven by testis which is w/o abscesses on [place]. Good chance of response to broad spectrum ABX.    We will consider bedside I+D if clinical status deteriorates significantly.    2 - Acute Renal Failure - Liekly pre-renal in setting of SIRS. Consider renal US to r/o hydro if does not respond to treatment of his infection.  Please call me  directly with questions anytime.   Alexis Frock 04/23/2019, 7:07 PM

## 2019-04-23 NOTE — Progress Notes (Signed)
New LDA charted 04/06/2019 at [facility] (back dated to match original intubation time and date) per ICU Charge RN due to name change, chart correction until previous chart data could be combined.    Chart information was combined this morning.  I have removed the LDA listed above.  Charting is again being documented on the original LDA.

## 2019-04-23 NOTE — Progress Notes (Addendum)
Pharmacy Antibiotic Note  Spencer Richard is a 58 y.o. male admitted on 04/29/2019 with pneumonia.  Pharmacy managing Cefepime dosing. Leukocytosis continues to improve. Now afebrile. SCr continues to rise. CrCl now < 30 mL/min.   Plan: Decrease Cefepime to 2 gm IV Q 24 hours  Follow up renal function, culture results, and clinical course.  Height: 5\' 3"  (160 cm) Weight: 219 lb 5.7 oz (99.5 kg) IBW/kg (Calculated) : 56.9  Temp (24hrs), Avg:99.8 F (37.7 C), Min:98.8 F (37.1 C), Max:100.8 F (38.2 C)  Recent Labs  Lab 04/18/19 0427 04/19/19 0337 04/20/19 0330 04/20/19 2120 04/21/19 0502 04/27/2019 0455 04/23/19 0350  WBC 11.5* 10.6* 19.3*  --  31.2* 21.2*  --   CREATININE 0.86 0.77 1.30* 1.51* 1.17 2.33* 3.10*    Estimated Creatinine Clearance: 26.8 mL/min (A) (by C-G formula based on SCr of 3.1 mg/dL (H)).    No Known Allergies  Antimicrobials this admission: 11/15 Remdesivir >> 11/19 11/16 Ceftriaxone/Azith >> 11/20 11/21 Cefepime >> 11/24 11/24 vanc>> 11/24 meropenem>>  Dose adjustments this admission:   Microbiology results: 11/15 BCx: negF  11/15 SARS coronavirus 2: positive 11/15 MRSA PCR: neg 11/21 BCx: ngtd  11/21 Resp Cxt: Klebsiella pneumoniae pan sens except for amp  Thank you for allowing pharmacy to be a part of this patient's care.  Spencer Richard, PharmD., BCPS Clinical Pharmacist Clinical phone for 04/23/19 until 5pm: [phone]   Addendum:   Vanc and meropenem have been ordered to replace cefepime to cover for perineum/scrotal abscess. Due to his changing renal function, we will dose base on trough.  Scr 3.1  Vanc 1.5g x1 then 1g IV q48 F/u with trough  Merrem 2g IV x1 then 1g IV q12  Spencer Richard, PharmD, [place], AAHIVP, CPP Infectious Disease Pharmacist 04/23/2019 5:35 PM

## 2019-04-24 ENCOUNTER — Inpatient Hospital Stay (HOSPITAL_COMMUNITY): Payer: HRSA Program

## 2019-04-24 ENCOUNTER — Encounter (HOSPITAL_COMMUNITY): Payer: Self-pay

## 2019-04-24 DIAGNOSIS — U071 COVID-19: Principal | ICD-10-CM

## 2019-04-24 DIAGNOSIS — J80 Acute respiratory distress syndrome: Secondary | ICD-10-CM

## 2019-04-24 DIAGNOSIS — N179 Acute kidney failure, unspecified: Secondary | ICD-10-CM

## 2019-04-24 LAB — GLUCOSE, CAPILLARY
Glucose-Capillary: 111 mg/dL — ABNORMAL HIGH (ref 70–99)
Glucose-Capillary: 120 mg/dL — ABNORMAL HIGH (ref 70–99)
Glucose-Capillary: 125 mg/dL — ABNORMAL HIGH (ref 70–99)
Glucose-Capillary: 159 mg/dL — ABNORMAL HIGH (ref 70–99)
Glucose-Capillary: 164 mg/dL — ABNORMAL HIGH (ref 70–99)
Glucose-Capillary: 171 mg/dL — ABNORMAL HIGH (ref 70–99)
Glucose-Capillary: 179 mg/dL — ABNORMAL HIGH (ref 70–99)
Glucose-Capillary: 183 mg/dL — ABNORMAL HIGH (ref 70–99)

## 2019-04-24 LAB — CBC WITH DIFFERENTIAL/PLATELET
Abs Immature Granulocytes: 0.15 10*3/uL — ABNORMAL HIGH (ref 0.00–0.07)
Basophils Absolute: 0.1 10*3/uL (ref 0.0–0.1)
Basophils Relative: 1 %
Eosinophils Absolute: 0.1 10*3/uL (ref 0.0–0.5)
Eosinophils Relative: 0 %
HCT: 32.2 % — ABNORMAL LOW (ref 39.0–52.0)
Hemoglobin: 8.9 g/dL — ABNORMAL LOW (ref 13.0–17.0)
Immature Granulocytes: 1 %
Lymphocytes Relative: 5 %
Lymphs Abs: 0.9 10*3/uL (ref 0.7–4.0)
MCH: 20.7 pg — ABNORMAL LOW (ref 26.0–34.0)
MCHC: 27.6 g/dL — ABNORMAL LOW (ref 30.0–36.0)
MCV: 74.9 fL — ABNORMAL LOW (ref 80.0–100.0)
Monocytes Absolute: 1.3 10*3/uL — ABNORMAL HIGH (ref 0.1–1.0)
Monocytes Relative: 8 %
Neutro Abs: 13.5 10*3/uL — ABNORMAL HIGH (ref 1.7–7.7)
Neutrophils Relative %: 85 %
Platelets: 280 10*3/uL (ref 150–400)
RBC: 4.3 MIL/uL (ref 4.22–5.81)
RDW: 21 % — ABNORMAL HIGH (ref 11.5–15.5)
WBC: 16 10*3/uL — ABNORMAL HIGH (ref 4.0–10.5)
nRBC: 0.1 % (ref 0.0–0.2)

## 2019-04-24 LAB — CREATININE, URINE, RANDOM: Creatinine, Urine: 36.82 mg/dL

## 2019-04-24 LAB — URINALYSIS, ROUTINE W REFLEX MICROSCOPIC
Bilirubin Urine: NEGATIVE
Glucose, UA: NEGATIVE mg/dL
Ketones, ur: NEGATIVE mg/dL
Leukocytes,Ua: NEGATIVE
Nitrite: NEGATIVE
Protein, ur: 30 mg/dL — AB
Specific Gravity, Urine: 1.013 (ref 1.005–1.030)
pH: 5 (ref 5.0–8.0)

## 2019-04-24 LAB — BASIC METABOLIC PANEL
Anion gap: 12 (ref 5–15)
BUN: 143 mg/dL — ABNORMAL HIGH (ref 6–20)
CO2: 23 mmol/L (ref 22–32)
Calcium: 6.9 mg/dL — ABNORMAL LOW (ref 8.9–10.3)
Chloride: 117 mmol/L — ABNORMAL HIGH (ref 98–111)
Creatinine, Ser: 4.27 mg/dL — ABNORMAL HIGH (ref 0.61–1.24)
GFR calc Af Amer: 16 mL/min — ABNORMAL LOW (ref >60)
GFR calc non Af Amer: 14 mL/min — ABNORMAL LOW (ref >60)
Glucose, Bld: 167 mg/dL — ABNORMAL HIGH (ref 70–99)
Potassium: 4.9 mmol/L (ref 3.5–5.1)
Sodium: 152 mmol/L — ABNORMAL HIGH (ref 135–145)

## 2019-04-24 LAB — SODIUM, URINE, RANDOM: Sodium, Ur: 67 mmol/L

## 2019-04-24 LAB — MAGNESIUM: Magnesium: 2.7 mg/dL — ABNORMAL HIGH (ref 1.7–2.4)

## 2019-04-24 MED ORDER — LACTATED RINGERS IV SOLN
INTRAVENOUS | Status: DC
  Administered 2019-04-24: 09:00:00 via INTRAVENOUS

## 2019-04-24 MED ORDER — DEXAMETHASONE SODIUM PHOSPHATE 10 MG/ML IJ SOLN
6.0000 mg | INTRAMUSCULAR | Status: AC
  Administered 2019-04-24 – 2019-04-26 (×3): 6 mg via INTRAVENOUS
  Filled 2019-04-24 (×3): qty 1

## 2019-04-24 MED ORDER — SODIUM CHLORIDE 0.9 % FOR CRRT
INTRAVENOUS_CENTRAL | Status: DC | PRN
  Filled 2019-04-24: qty 1000

## 2019-04-24 MED ORDER — CALCIUM GLUCONATE-NACL 1-0.675 GM/50ML-% IV SOLN
1.0000 g | Freq: Once | INTRAVENOUS | Status: AC
  Administered 2019-04-24: 09:00:00 1000 mg via INTRAVENOUS
  Filled 2019-04-24: qty 50

## 2019-04-24 MED ORDER — PRISMASOL BGK 4/2.5 32-4-2.5 MEQ/L REPLACEMENT SOLN
Status: DC
  Administered 2019-04-24 – 2019-04-27 (×6): via INTRAVENOUS_CENTRAL

## 2019-04-24 MED ORDER — AMIODARONE LOAD VIA INFUSION
150.0000 mg | Freq: Once | INTRAVENOUS | Status: AC
  Administered 2019-04-24: 150 mg via INTRAVENOUS
  Filled 2019-04-24: qty 83.34

## 2019-04-24 MED ORDER — PRISMASOL BGK 4/2.5 32-4-2.5 MEQ/L REPLACEMENT SOLN
Status: DC
  Administered 2019-04-24 – 2019-04-27 (×4): via INTRAVENOUS_CENTRAL

## 2019-04-24 MED ORDER — SODIUM CHLORIDE 0.9 % IV SOLN
750.0000 [IU]/h | INTRAVENOUS | Status: DC
  Administered 2019-04-24 – 2019-04-27 (×6): 750 [IU]/h via INTRAVENOUS_CENTRAL
  Filled 2019-04-24: qty 10000
  Filled 2019-04-24: qty 2
  Filled 2019-04-24 (×5): qty 10000

## 2019-04-24 MED ORDER — ALTEPLASE 2 MG IJ SOLR
2.0000 mg | Freq: Once | INTRAMUSCULAR | Status: DC | PRN
  Filled 2019-04-24: qty 2

## 2019-04-24 MED ORDER — PRISMASOL BGK 4/2.5 32-4-2.5 MEQ/L IV SOLN
INTRAVENOUS | Status: DC
  Administered 2019-04-24 – 2019-04-27 (×18): via INTRAVENOUS_CENTRAL

## 2019-04-24 MED ORDER — VANCOMYCIN HCL IN DEXTROSE 1-5 GM/200ML-% IV SOLN
1000.0000 mg | INTRAVENOUS | Status: DC
  Administered 2019-04-24 – 2019-04-26 (×3): 1000 mg via INTRAVENOUS
  Filled 2019-04-24 (×5): qty 200

## 2019-04-24 MED ORDER — HEPARIN SODIUM (PORCINE) 1000 UNIT/ML DIALYSIS
1000.0000 [IU] | INTRAMUSCULAR | Status: DC | PRN
  Administered 2019-04-24: 18:00:00 2800 [IU] via INTRAVENOUS_CENTRAL
  Administered 2019-04-25 – 2019-04-26 (×3): 1400 [IU] via INTRAVENOUS_CENTRAL
  Filled 2019-04-24: qty 2
  Filled 2019-04-24: qty 6
  Filled 2019-04-24: qty 5

## 2019-04-24 MED ORDER — LACTATED RINGERS IV BOLUS
500.0000 mL | Freq: Once | INTRAVENOUS | Status: AC
  Administered 2019-04-24: 500 mL via INTRAVENOUS

## 2019-04-24 MED ORDER — DILTIAZEM HCL 25 MG/5ML IV SOLN
10.0000 mg | Freq: Once | INTRAVENOUS | Status: AC
  Administered 2019-04-24: 17:00:00 10 mg via INTRAVENOUS
  Filled 2019-04-24: qty 5

## 2019-04-24 MED ORDER — ACETAMINOPHEN 325 MG PO TABS
650.0000 mg | ORAL_TABLET | Freq: Four times a day (QID) | ORAL | Status: DC | PRN
  Administered 2019-04-24: 650 mg via ORAL
  Filled 2019-04-24: qty 2

## 2019-04-24 NOTE — Progress Notes (Addendum)
Pt will be started on continuous heparin through the CRRT circuit at 750 units/hr per Dr. Jonnie Finner. He is also currently on SQ heparin for DVT prophlaxis at 7500 units TID. We will get the heparin level in AM to assess for therapeutic range 0.2-0.5.   Pt will also be started on CRRT tonight. We will adjust his merrem and vanc.  Change vanc to 1g IV q24 Continue Merrem 1g IV q12  Onnie Boer, PharmD, [place], AAHIVP, CPP Infectious Disease Pharmacist 04/24/2019 6:11 PM

## 2019-04-24 NOTE — Procedures (Signed)
Hemodialysis Catheter Insertion Procedure Note Jakin Pavao [id number] July 16, 1960  Procedure: Insertion of Central Venous Catheter Indications: AKI, need for CVVHD  Procedure Details Consent: Unable to obtain consent because of emergent medical necessity. Time Out: Verified patient identification, verified procedure, site/side was marked, verified correct patient position, special equipment/implants available, medications/allergies/relevent history reviewed, required imaging and test results available.  Performed  Maximum sterile technique was used including antiseptics, cap, gloves, gown, hand hygiene, mask and sheet. Skin prep: Chlorhexidine; local anesthetic administered A antimicrobial bonded/coated triple lumen catheter was placed in the left internal jugular vein using the Seldinger technique.  Ultrasound was used to verify the patency of the vein and for real time needle guidance.  Evaluation Blood flow good Complications: No apparent complications Patient did tolerate procedure well. Chest X-ray ordered to verify placement.  CXR: pending.  Simonne Maffucci 04/24/2019, 5:56 PM

## 2019-04-24 NOTE — Consult Note (Signed)
Renal Service Consult Note [facility]  [place] 04/24/2019 Sol Blazing Requesting Physician:  Dr Lake Bells  Reason for Consult:  Renal failure HPI: The patient is a 58 y.o. year-old w/ PMH of HTN, obesity and depression presented to ED on 04/13/2019 w/ SOB, cough, fevers, temp 103 found to have COVID+ PNA. On 11/17 pt was intubated, on 11/21 had new afib and converted to NSR on 11/22. 11/24 urology consulted for scrotal edema, they didn't suspect Fournier's. Abx were broadened to cover this. He had klebs PNA HCAP as well as etoh withdrawal. Not sure home meds.  Has developed AKI w/ rising BUN/Cr up today to 143 and 4.27 respectively.  Asked to see for renal failure.   Pt unable to provide any hx.  Pt seen in ICU.  On vent    ROS - n/a   Past Medical History No past medical history on file. Past Surgical History The histories are not reviewed yet. Please review them in the "History" navigator section and refresh this Irene. Family History No family history on file. Social History  has no history on file for tobacco, alcohol, and drug. Allergies No Known Allergies Home medications Prior to Admission medications   Not on File   Liver Function Tests Recent Labs  Lab 04/18/19 0427 04/19/19 0337 04/20/19 0330 04/23/19 1540  AST 38 36 44*  --   ALT 27 28 34  --   ALKPHOS 91 90 107  --   BILITOT 0.4 0.7 0.8  --   PROT 7.2 6.7 7.5  --   ALBUMIN 2.7* 2.5* 2.6* 1.8*   No results for input(s): LIPASE, AMYLASE in the last 168 hours. CBC Recent Labs  Lab 04/19/19 0337 04/20/19 0330 04/21/19 0502  04/16/2019 0455 04/23/19 1121 04/24/19 0456  WBC 10.6* 19.3* 31.2*  --  21.2*  --  16.0*  NEUTROABS 8.6* 16.8*  --   --   --   --  13.5*  HGB 10.4* 12.3* 12.0*   < > 9.7* 9.9* 8.9*  HCT 37.1* 44.5 42.8   < > 33.9* 29.0* 32.2*  MCV 73.0* 73.7* 74.0*  --  72.9*  --  74.9*  PLT 531* 631* 716*  --  570*  --  280   < > = values in this interval not displayed.    Basic Metabolic Panel Recent Labs  Lab 04/18/19 0427 04/19/19 0337 04/20/19 0330 04/20/19 2120 04/21/19 0502 04/29/2019 0415 03/31/2019 0455 04/23/19 0350 04/23/19 1121 04/23/19 1540 04/24/19 0456  NA 147* 149* 149* 148* 146* 148* 150* 153* 149* 151* 152*  K 4.2 4.3 3.9 4.6 4.2 4.4 4.4 5.0 4.7 4.7 4.9  CL 104 108 107 107 108  --  114* 115*  --  114* 117*  CO2 33* 31 29 30 28   --  26 25  --  23 23  GLUCOSE 148* 153* 228* 167* 181*  --  142* 134*  --  114* 167*  BUN 45* 40* 50* 64* 66*  --  91* 107*  --  136* 143*  CREATININE 0.86 0.77 1.30* 1.51* 1.17  --  2.33* 3.10*  --  3.71* 4.27*  CALCIUM 8.6* 8.6* 8.6* 8.3* 8.3*  --  8.0* 8.0*  --  7.3* 6.9*  PHOS 3.5 3.2  --   --  4.2  --  4.3  --   --  5.8*  --    Iron/TIBC/Ferritin/ %Sat    Component Value Date/Time   FERRITIN 79 04/20/2019 0330  Vitals:   04/24/19 1200 04/24/19 1300 04/24/19 1400 04/24/19 1500  BP: 96/72 94/65 94/73  113/79  Pulse: (!) 147 (!) 145 (!) 136 (!) 158  Resp: (!) 26 (!) 26 (!) 26 (!) 26  Temp: 99.8 F (37.7 C)     TempSrc: Oral     SpO2: 99% 98% 98% 100%  Weight:      Height:        Exam Gen on vent, sedated, not responsive  BP 90's - 112/63  RR 26  Temp 100.6  HR 140's w/ afib  FiO2 40%  No rash, cyanosis or gangrene Sclera anicteric, throat w ETT  No jvd or bruits Chest clear bilat ant/ lat, no rales or wheezing RRR tachy, irreg, HR 140's, no rub or gallop Abd soft ntnd no mass or ascites +bs mod obese GU normal male w/ foley in place MS no joint effusions or deformity Ext 2+ UE edema > 1+ LE edema, no wounds or ulcers Neuro is sedated on vent    Home meds:  - from H&P the following meds were listed >   lurasidone 80 mg qd/ chlordiazepoxide 10mg  qd prn withdrawal/ mirtazapine 15mg  hs/ paroxetine 20 mg qd    CXR - bilat infiltrates    I/O last 3 days > 10L +/ 7.5 L neg, net + 2.8L  Assessment/ Plan: 1. AKI - due to ATN most likely from hypoperfusion/ shock requiring pressors  earlier. Sig vol overload on exam, not sure central pressures. Will get UA , urine lytes and renal .  Will likely need CRRT within the next 1-2 days.  Will follow. 2. COVID+ PNA/ resp failure - on vent 40% fiO2 3. H/o depression 4. Anemia - Hb 8.9 5. Vol overload - will dc maint IVF's 6. Afib w/ RVR - on amio IV       MD 04/24/2019, 3:50 PM

## 2019-04-24 NOTE — Progress Notes (Signed)
NAME:  Spencer Richard, MRN:  [id number], DOB:  [birth year], LOS: 2 ADMISSION DATE:  04/16/2019, CONSULTATION DATE:  11/17 REFERRING MD:  TRH, CHIEF COMPLAINT:  dyspnea   Brief History   Corrected age and identity of 58 y/o alcoholic male with COVID 19 pneumonia, intubated in setting of encephalopathy and respiratory failure on 11/17. (was identified as Hanska on admission)  Past Medical History  HTN  Significant Hospital Events   11/15-Admit 11/17- Intubation for respiratory failure, severe bradycardia on precedex requiring atropine 11/21- Persistent fevers, Rapid afib, started amio 11/22- Converted to NSR 11/24-scrotal edema concerning for gas-forming severe epididymorchitis   Consults:  PCCM Urology  Nephrology   Procedures:  ETT 11/17 >  R IJ CVL 11/21>   Significant Diagnostic Tests:  11/24 scrotal US "Soft tissue swelling in particularly the LEFT hemiscrotum associated with numerous bright echogenic foci which demonstrate posterior acoustic shadowing consistent with gas, representing Fournier's Gangrene."  Micro Data:  COVID 19 positive Blood cx 11/15>>>NTD Tracheal aspirate 11/21>Klebsiella, ampicillin resistant Blood 11/24>>  Antimicrobials:  Remdisivir 11/15>>> 11/19 Rocephin 11/15>>> 11/20 Zithromax 11/16>>> 11/20  Cefepime 11/21 >>11/24 Decadron 11/16 (note that d/t pt being identity clarified 11/24, decadron order "fell off" from chart merge so 3 more doses ordered 11/25) >>  Meropenem 11/24>> Vancomycin 11/24>>  Interim history/subjective:  Scrotal US shows new gas-forming severe epididymorchitis. Worsening renal failure.  Febrile, tachycardic/AF, now on pressors.   Objective   Blood pressure 99/60, pulse (!) 141, temperature 99.5 F (37.5 C), temperature source Axillary, resp. rate (!) 29, height 5\' 3"  (1.6 m), weight 102 kg, SpO2 100 %.    Vent Mode: PRVC FiO2 (%):  [40 %] 40 % Set Rate:  [18 bmp-26 bmp] 26 bmp Vt Set:  [450 mL]  450 mL PEEP:  [5 cmH20] 5 cmH20 Pressure Support:  [10 cmH20] 10 cmH20 Plateau Pressure:  [16 cmH20-19 cmH20] 16 cmH20   Intake/Output Summary (Last 24 hours) at 04/24/2019 0717 Last data filed at 04/24/2019 0700 Gross per 24 hour  Intake 5175.94 ml  Output 4631 ml  Net 544.94 ml   Filed Weights   04/27/2019 1700 04/23/19 0425 04/24/19 0423  Weight: 99 kg 99.5 kg 102 kg    Examination:   General: Acutely ill-appearing, well-developed male, lying in bed, NAD  HENT: Normocephalic, PERRL. Moist mucus membranes Neck: No JVD. Trachea midline.  CV: IRRR. S1S2. No MRG. +2 radial pulses. Faint DP/PT pulses. Readily dopplered PT/PT pulses. Feet cool to touch  Lungs: BBS rhonchi  symmetrical, FNL, vent supported ABD: +BS x4. SNT/ND. No masses, guarding or rigidity GU: foley in place, draining clear yellow urine.  Marked scrotal edema left greater than right, tenseness noted to left side, palpable crepitus EXT: MAE spontaneously,  generalized edema Skin: flushed. WD. In tact. No rashes or lesions Neuro: Grimaces to noxious stimuli, moves spontaneously, does not follow commands   11/25 CXR personally reviewed: b/l infiltrates, R IJ CVC in adequate position, ETT in place  Resolved Hospital Problem list     Assessment & Plan:  ARDS 2/2 COVID19 Pneumonia: Continues to have slow improvement Continue mechanical ventilation per ARDS protocol Target TVol 6-8cc/kgIBW Target Plateau Pressure < 30cm H20 Target driving pressure less than 15 cm of water Target PaO2 55-65: titrate PEEP/FiO2 per protocol As long as PaO2 to FiO2 ratio is less than 1:150 position in prone position for 16 hours a day-currently not requiring same Ventilator associated pneumonia prevention protocol Required increased sedation overnight with the addition  of Versed due to vent dyssynchrony. RASS goal 0 to -1   Paroxysmal rapid atrial fibrillation.  Continue amiodarone infusion Continue as needed metoprolol Treat  sepsis   Hypotention due to sedation-resolved, now with hypotension secondary to septic shock.  See below.  ARF-type factorial including medications, septic shock, hypoperfusion.  Discontinued Latuda due to same. BUN/Crea steadily rising despite adequate UO.  Potassium holding steady at 4.9.  Foley placed 11/24 Patient likely heading towards CRRT. Nephrology consulted.   Hypernatremia.  5.5 L free water deficit Continue free water   Septic shock 2/2 Severe Epididymorchitis / Gas-Forming-febrile, tachycardic, decreasing PLT Antibiotics broadened to meropenem and vancomycin 11/24 Follow-up cultures Urology consulted 11/24-do not suspect Fournier's at this time.  Patient will likely require bedside I&D.  Appreciate urology assistance. IVF Pressors to contain map goal 65   Klebsiella pneumonia: Probable HCAP Now on meropenem.  See above.  EtOH withdrawal: Continues to require Versed infusion for sedation, vent synchrony agitation Wean fentanyl infusion as able Continue multivitamin, thiamine, folic acid   Elevated blood sugar with A1c 6.7-probable undiagnosed diabetes mellitus Continue SSI  FEN Tube feeding  Best practice:  Diet: tube feeding Pain/Anxiety/Delirium protocol (if indicated): as above VAP protocol (if indicated): yes DVT prophylaxis: Heparin due to worsening renal function GI prophylaxis: PPI Glucose control: SSI  Mobility: bed rest Code Status: FULL  Family Communication: will update Disposition: remain in ICU  Labs  Reviewed in EMR     The patient is critically ill with respiratory failure and septic shock. He requires ICU for high complexity decision making, titration of high alert medications, ventilator management, titration of oxygen and interpretation of advanced monitoring.    I personally spent 45 minutes providing critical care services including personally reviewing test results, discussing care with nursing staff/other physicians and  completing orders pertaining to this patient.  Time was exclusive to the patient and does not include time spent teaching or in procedures.  Voice recognition software was used in the production of this record.  Errors in interpretation may have been inadvertently missed during review.  Karin Lieu, MSN, AGACNP  [facility]

## 2019-04-24 NOTE — Care Plan (Signed)
Updated nephew Carcione by phone.

## 2019-04-24 NOTE — Plan of Care (Signed)
Patient unable to participate in teaching at this time d/t medical acuity. Will continue teaching with family via phone. OGT with Vital AF 1.2@goal  rate, tolerating well with minimal residuals. 300cc Water flushes Q8H for high sodium levels. Emotional support provided to patient and family as needed. No s/s of pain noted. Fentanyl and versed for sedation. Safe environment of care maintained. Skin intact. T&R Q2H. Bony prominences elevated off bed. Discharge planning ongoing, but patient remains in ICU level of care at this time. Remains in Afib RVR, had short (approx 1hour break back into SR, but returned to afib RVR), team aware. Amio infusion continues, amio bolus given per MD without effect on Hr/rhythm. Titrating neo infusion to maintain MAP>65. WBC elevated, continuing on IV abx per team. Febrile, cooling blanket in place. Tylenol PRN. Kidney function continues to worsen, team may consider CRRT if trend continues. Remains intubated at this time. Passive ROM. Foley in place, will d/c per urology recommendations d/t ongoing urologic issues. Rectal tube remains in place for loose BMS. Will continue to monitor patient.

## 2019-04-24 NOTE — Progress Notes (Signed)
Attempted to reach patient's nephew to provide update, no answer. Voicemail left

## 2019-04-25 ENCOUNTER — Inpatient Hospital Stay (HOSPITAL_COMMUNITY): Payer: HRSA Program

## 2019-04-25 ENCOUNTER — Encounter (HOSPITAL_COMMUNITY): Payer: Self-pay

## 2019-04-25 DIAGNOSIS — A419 Sepsis, unspecified organism: Secondary | ICD-10-CM | POA: Diagnosis present

## 2019-04-25 DIAGNOSIS — I9589 Other hypotension: Secondary | ICD-10-CM

## 2019-04-25 DIAGNOSIS — I480 Paroxysmal atrial fibrillation: Secondary | ICD-10-CM

## 2019-04-25 DIAGNOSIS — F10239 Alcohol dependence with withdrawal, unspecified: Secondary | ICD-10-CM | POA: Diagnosis present

## 2019-04-25 DIAGNOSIS — N5089 Other specified disorders of the male genital organs: Secondary | ICD-10-CM

## 2019-04-25 DIAGNOSIS — N179 Acute kidney failure, unspecified: Secondary | ICD-10-CM | POA: Diagnosis present

## 2019-04-25 DIAGNOSIS — J80 Acute respiratory distress syndrome: Secondary | ICD-10-CM | POA: Diagnosis present

## 2019-04-25 DIAGNOSIS — U071 COVID-19: Secondary | ICD-10-CM | POA: Diagnosis present

## 2019-04-25 DIAGNOSIS — R6521 Severe sepsis with septic shock: Secondary | ICD-10-CM

## 2019-04-25 DIAGNOSIS — F10939 Alcohol use, unspecified with withdrawal, unspecified: Secondary | ICD-10-CM | POA: Diagnosis present

## 2019-04-25 DIAGNOSIS — E089 Diabetes mellitus due to underlying condition without complications: Secondary | ICD-10-CM

## 2019-04-25 DIAGNOSIS — J150 Pneumonia due to Klebsiella pneumoniae: Secondary | ICD-10-CM

## 2019-04-25 DIAGNOSIS — Z9911 Dependence on respirator [ventilator] status: Secondary | ICD-10-CM

## 2019-04-25 DIAGNOSIS — R7303 Prediabetes: Secondary | ICD-10-CM

## 2019-04-25 DIAGNOSIS — J9601 Acute respiratory failure with hypoxia: Secondary | ICD-10-CM | POA: Diagnosis present

## 2019-04-25 DIAGNOSIS — J189 Pneumonia, unspecified organism: Secondary | ICD-10-CM | POA: Diagnosis present

## 2019-04-25 DIAGNOSIS — J1282 Pneumonia due to coronavirus disease 2019: Secondary | ICD-10-CM | POA: Diagnosis present

## 2019-04-25 DIAGNOSIS — I959 Hypotension, unspecified: Secondary | ICD-10-CM | POA: Diagnosis present

## 2019-04-25 LAB — RENAL FUNCTION PANEL
Albumin: 1.5 g/dL — ABNORMAL LOW (ref 3.5–5.0)
Albumin: 1.6 g/dL — ABNORMAL LOW (ref 3.5–5.0)
Anion gap: 10 (ref 5–15)
Anion gap: 11 (ref 5–15)
BUN: 96 mg/dL — ABNORMAL HIGH (ref 6–20)
BUN: 78 mg/dL — ABNORMAL HIGH (ref 6–20)
CO2: 24 mmol/L (ref 22–32)
CO2: 25 mmol/L (ref 22–32)
Calcium: 7.3 mg/dL — ABNORMAL LOW (ref 8.9–10.3)
Calcium: 7.3 mg/dL — ABNORMAL LOW (ref 8.9–10.3)
Chloride: 109 mmol/L (ref 98–111)
Chloride: 107 mmol/L (ref 98–111)
Creatinine, Ser: 2.8 mg/dL — ABNORMAL HIGH (ref 0.61–1.24)
Creatinine, Ser: 2.37 mg/dL — ABNORMAL HIGH (ref 0.61–1.24)
GFR calc Af Amer: 27 mL/min — ABNORMAL LOW (ref >60)
GFR calc Af Amer: 33 mL/min — ABNORMAL LOW (ref >60)
GFR calc non Af Amer: 24 mL/min — ABNORMAL LOW (ref >60)
GFR calc non Af Amer: 29 mL/min — ABNORMAL LOW (ref >60)
Glucose, Bld: 180 mg/dL — ABNORMAL HIGH (ref 70–99)
Glucose, Bld: 166 mg/dL — ABNORMAL HIGH (ref 70–99)
Phosphorus: 4 mg/dL (ref 2.5–4.6)
Phosphorus: 4.9 mg/dL — ABNORMAL HIGH (ref 2.5–4.6)
Potassium: 4.2 mmol/L (ref 3.5–5.1)
Potassium: 4.4 mmol/L (ref 3.5–5.1)
Sodium: 143 mmol/L (ref 135–145)
Sodium: 143 mmol/L (ref 135–145)

## 2019-04-25 LAB — POCT I-STAT 7, (LYTES, BLD GAS, ICA,H+H)
Acid-base deficit: 1 mmol/L (ref 0.0–2.0)
Bicarbonate: 26.1 mmol/L (ref 20.0–28.0)
Calcium, Ion: 1.03 mmol/L — ABNORMAL LOW (ref 1.15–1.40)
HCT: 32 % — ABNORMAL LOW (ref 39.0–52.0)
Hemoglobin: 10.9 g/dL — ABNORMAL LOW (ref 13.0–17.0)
O2 Saturation: 94 %
Patient temperature: 97.5
Potassium: 4 mmol/L (ref 3.5–5.1)
Sodium: 143 mmol/L (ref 135–145)
TCO2: 28 mmol/L (ref 22–32)
pCO2 arterial: 53.3 mmHg — ABNORMAL HIGH (ref 32.0–48.0)
pH, Arterial: 7.295 — ABNORMAL LOW (ref 7.350–7.450)
pO2, Arterial: 78 mmHg — ABNORMAL LOW (ref 83.0–108.0)

## 2019-04-25 LAB — CBC WITH DIFFERENTIAL/PLATELET
Abs Immature Granulocytes: 0.26 10*3/uL — ABNORMAL HIGH (ref 0.00–0.07)
Basophils Absolute: 0.1 10*3/uL (ref 0.0–0.1)
Basophils Relative: 1 %
Eosinophils Absolute: 0 10*3/uL (ref 0.0–0.5)
Eosinophils Relative: 0 %
HCT: 33.5 % — ABNORMAL LOW (ref 39.0–52.0)
Hemoglobin: 9.5 g/dL — ABNORMAL LOW (ref 13.0–17.0)
Immature Granulocytes: 2 %
Lymphocytes Relative: 6 %
Lymphs Abs: 0.8 10*3/uL (ref 0.7–4.0)
MCH: 20.3 pg — ABNORMAL LOW (ref 26.0–34.0)
MCHC: 28.4 g/dL — ABNORMAL LOW (ref 30.0–36.0)
MCV: 71.6 fL — ABNORMAL LOW (ref 80.0–100.0)
Monocytes Absolute: 0.9 10*3/uL (ref 0.1–1.0)
Monocytes Relative: 7 %
Neutro Abs: 11.1 10*3/uL — ABNORMAL HIGH (ref 1.7–7.7)
Neutrophils Relative %: 84 %
Platelets: 257 10*3/uL (ref 150–400)
RBC: 4.68 MIL/uL (ref 4.22–5.81)
RDW: 21.2 % — ABNORMAL HIGH (ref 11.5–15.5)
WBC: 13.2 10*3/uL — ABNORMAL HIGH (ref 4.0–10.5)
nRBC: 0.2 % (ref 0.0–0.2)

## 2019-04-25 LAB — APTT: aPTT: 46 seconds — ABNORMAL HIGH (ref 24–36)

## 2019-04-25 LAB — COMPREHENSIVE METABOLIC PANEL
ALT: 22 U/L (ref 0–44)
AST: 38 U/L (ref 15–41)
Albumin: 1.5 g/dL — ABNORMAL LOW (ref 3.5–5.0)
Alkaline Phosphatase: 128 U/L — ABNORMAL HIGH (ref 38–126)
Anion gap: 11 (ref 5–15)
BUN: 85 mg/dL — ABNORMAL HIGH (ref 6–20)
CO2: 24 mmol/L (ref 22–32)
Calcium: 6.6 mg/dL — ABNORMAL LOW (ref 8.9–10.3)
Chloride: 108 mmol/L (ref 98–111)
Creatinine, Ser: 2.41 mg/dL — ABNORMAL HIGH (ref 0.61–1.24)
GFR calc Af Amer: 33 mL/min — ABNORMAL LOW (ref >60)
GFR calc non Af Amer: 28 mL/min — ABNORMAL LOW (ref >60)
Glucose, Bld: 139 mg/dL — ABNORMAL HIGH (ref 70–99)
Potassium: 4.3 mmol/L (ref 3.5–5.1)
Sodium: 143 mmol/L (ref 135–145)
Total Bilirubin: 0.4 mg/dL (ref 0.3–1.2)
Total Protein: 5.9 g/dL — ABNORMAL LOW (ref 6.5–8.1)

## 2019-04-25 LAB — CBC
HCT: 32.2 % — ABNORMAL LOW (ref 39.0–52.0)
Hemoglobin: 9.2 g/dL — ABNORMAL LOW (ref 13.0–17.0)
MCH: 20.5 pg — ABNORMAL LOW (ref 26.0–34.0)
MCHC: 28.6 g/dL — ABNORMAL LOW (ref 30.0–36.0)
MCV: 71.7 fL — ABNORMAL LOW (ref 80.0–100.0)
Platelets: 257 10*3/uL (ref 150–400)
RBC: 4.49 MIL/uL (ref 4.22–5.81)
RDW: 20.8 % — ABNORMAL HIGH (ref 11.5–15.5)
WBC: 12.2 10*3/uL — ABNORMAL HIGH (ref 4.0–10.5)
nRBC: 0 % (ref 0.0–0.2)

## 2019-04-25 LAB — PHOSPHORUS: Phosphorus: 3.8 mg/dL (ref 2.5–4.6)

## 2019-04-25 LAB — GLUCOSE, CAPILLARY
Glucose-Capillary: 143 mg/dL — ABNORMAL HIGH (ref 70–99)
Glucose-Capillary: 157 mg/dL — ABNORMAL HIGH (ref 70–99)
Glucose-Capillary: 132 mg/dL — ABNORMAL HIGH (ref 70–99)
Glucose-Capillary: 152 mg/dL — ABNORMAL HIGH (ref 70–99)
Glucose-Capillary: 144 mg/dL — ABNORMAL HIGH (ref 70–99)

## 2019-04-25 LAB — MAGNESIUM
Magnesium: 2.6 mg/dL — ABNORMAL HIGH (ref 1.7–2.4)
Magnesium: 2.3 mg/dL (ref 1.7–2.4)

## 2019-04-25 LAB — C-REACTIVE PROTEIN: CRP: 36.2 mg/dL — ABNORMAL HIGH (ref <1.0)

## 2019-04-25 LAB — HEPARIN LEVEL (UNFRACTIONATED): Heparin Unfractionated: 0.14 IU/mL — ABNORMAL LOW (ref 0.30–0.70)

## 2019-04-25 LAB — PROCALCITONIN: Procalcitonin: 12.23 ng/mL

## 2019-04-25 LAB — D-DIMER, QUANTITATIVE: D-Dimer, Quant: 7.65 ug/mL-FEU — ABNORMAL HIGH (ref 0.00–0.50)

## 2019-04-25 MED ORDER — IPRATROPIUM-ALBUTEROL 0.5-2.5 (3) MG/3ML IN SOLN
RESPIRATORY_TRACT | Status: AC
  Administered 2019-04-25: 3 mL via RESPIRATORY_TRACT
  Filled 2019-04-25: qty 3

## 2019-04-25 MED ORDER — SODIUM CHLORIDE 0.9% FLUSH: 10.0000 mL | INTRAVENOUS | Status: DC | PRN

## 2019-04-25 MED ORDER — SODIUM CHLORIDE 0.9% FLUSH
10.0000 mL | Freq: Two times a day (BID) | INTRAVENOUS | Status: DC
  Administered 2019-04-25: 10:00:00 30 mL
  Administered 2019-04-25 – 2019-04-27 (×3): 10 mL
  Administered 2019-04-28: 40 mL

## 2019-04-25 MED ORDER — VECURONIUM BROMIDE 10 MG IV SOLR
10.0000 mg | INTRAVENOUS | Status: DC | PRN
  Administered 2019-04-25 – 2019-04-27 (×7): 10 mg via INTRAVENOUS
  Filled 2019-04-25 (×7): qty 10

## 2019-04-25 MED ORDER — ARTIFICIAL TEARS OPHTHALMIC OINT
1.0000 "application " | TOPICAL_OINTMENT | Freq: Three times a day (TID) | OPHTHALMIC | Status: DC
  Administered 2019-04-25 – 2019-04-28 (×8): 1 via OPHTHALMIC
  Filled 2019-04-25 (×3): qty 3.5

## 2019-04-25 MED ORDER — IPRATROPIUM-ALBUTEROL 0.5-2.5 (3) MG/3ML IN SOLN
3.0000 mL | RESPIRATORY_TRACT | Status: DC
  Administered 2019-04-25 – 2019-04-27 (×10): 3 mL via RESPIRATORY_TRACT
  Filled 2019-04-25 (×10): qty 3

## 2019-04-25 NOTE — Progress Notes (Signed)
Updated patients nephew Passow at 61. He states he is not getting regular phone calls for updates,will pass along to next nurse and charge to ensure pt 's nephew gets updates. His nephew also states non family members have been calling and he does not want any information given to anyone else. Will pass this along as well.

## 2019-04-25 NOTE — Progress Notes (Signed)
ABG results verbally given to Dr. Carlis Abbott, no new orders received at this time. RT will continue to monitor.

## 2019-04-25 NOTE — Progress Notes (Signed)
[facility] Progress Note  Subjective: no new issues per chart review  Vitals:   04/25/19 1445 04/25/19 1500 04/25/19 1515 04/25/19 1521  BP: 104/73 (!) 93/57 (!) 87/65   Pulse:      Resp: (!) 26 (!) 26 (!) 26   Temp:      TempSrc:      SpO2: 98% 96% 100% 100%  Weight:      Height:        Inpatient medications: . artificial tears  1 application Both Eyes I5O  . chlorhexidine  15 mL Mouth/Throat BID  . Chlorhexidine Gluconate Cloth  6 each Topical Daily  . dexamethasone (DECADRON) injection  6 mg Intravenous Q24H  . folic acid  1 mg Per Tube Daily  . free water  300 mL Per Tube Q8H  . heparin injection (subcutaneous)  7,500 Units Subcutaneous Q8H  . insulin aspart  0-15 Units Subcutaneous Q4H  . ipratropium-albuterol  3 mL Nebulization Q4H  . mouth rinse  15 mL Mouth Rinse 10 times per day  . multivitamin  15 mL Per Tube Daily  . pantoprazole sodium  40 mg Per Tube Daily  . sodium chloride flush  10-40 mL Intracatheter Q12H  . thiamine injection  100 mg Intravenous Daily   Or  . thiamine  100 mg Per Tube Daily   .  prismasol BGK 4/2.5 400 mL/hr at 04/25/19 0744  .  prismasol BGK 4/2.5 200 mL/hr at 04/24/19 1844  . sodium chloride 10 mL/hr at 04/25/19 1400  . amiodarone 30 mg/hr (04/25/19 1400)  . feeding supplement (VITAL AF 1.2 CAL) 75 mL/hr at 04/25/19 1400  . fentaNYL infusion INTRAVENOUS 300 mcg/hr (04/25/19 1400)  . heparin 10,000 units/ 20 mL infusion syringe 750 Units/hr (04/25/19 0641)  . meropenem (MERREM) IV Stopped (04/25/19 0631)  . midazolam 4 mg/hr (04/25/19 1400)  . phenylephrine (NEO-SYNEPHRINE) Adult infusion 5 mcg/min (04/25/19 1400)  . prismasol BGK 4/2.5 2,000 mL/hr at 04/25/19 1324  . vancomycin Stopped (04/24/19 2223)   sodium chloride, acetaminophen, alteplase, fentaNYL, heparin, metoprolol tartrate, midazolam, sodium chloride, sodium chloride flush, vecuronium    Exam:  Patient not examined today directly given COVID-19 +  status, utilizing exam of the primary team and observations of RN's.    Home meds:  - from H&P the following home meds were listed >   lurasidone 80 mg qd/ chlordiazepoxide 10mg  qd prn withdrawal/ mirtazapine 15mg  hs/ paroxetine 20 mg qd    CXR 11/25 - bilat mulitfocal infiltrates    I/O last 3 days > 10L +/ 7.5 L neg, net + 2.8L   UNa 67,  UCr 37       UA 11/25 > 11-20 wbc/ 21-50 rbc, 30 protein, 1.013   Assessment/ Plan: 1. AKI - due to ATN most likely from hypoperfusion/ shock that required pressors after admission. Sig vol overload on exam, not sure central pressures. Renal US pending. Started CRRT on 11/25:  - I/O +2.4L yest, so far today is 700 cc neg  - cont to attempt 50- 100 cc/ hr as tol, on low dose neo gtt   - getting SQ hep 7500 tid and CRRT hep flat rate 750u /hr  2. COVID+ PNA/ resp failure - on vent 40% fiO2 3. H/o depression 4. Anemia - Hb 8.9 5. Vol overload - dc'd maint IVF's 6. Afib w/ RVR - on amio IV    Spencer Richard 04/25/2019, 3:36 PM  Iron/TIBC/Ferritin/ %Sat    Component Value Date/Time  FERRITIN 79 04/20/2019 0330   Recent Labs  Lab 04/25/19 0958 04/25/19 1220  NA 143 143  K 4.3 4.0  CL 108  --   CO2 24  --   GLUCOSE 139*  --   BUN 85*  --   CREATININE 2.41*  --   CALCIUM 6.6*  --   PHOS 3.8  --   ALBUMIN 1.5*  --    Recent Labs  Lab 04/25/19 0958  AST 38  ALT 22  ALKPHOS 128*  BILITOT 0.4  PROT 5.9*   Recent Labs  Lab 04/25/19 0958 04/25/19 1220  WBC 13.2*  --   HGB 9.5* 10.9*  HCT 33.5* 32.0*  PLT 257  --

## 2019-04-25 NOTE — Plan of Care (Addendum)
Overnight Progress Note:  Net fluid status about -800cc for night shift. Pt continues to require fentanyl and versed gtt w/PRN boluses for coughing fits and vent dysynchrony. On Amio gtt for Afib RVR... rate better controlled overnight, mostly 110s-120s. On low dose Neo. Purposeful movement in all extremities.   Problem: Education: Goal: Knowledge of General Education information will improve Description: Including pain rating scale, medication(s)/side effects and non-pharmacologic comfort measures Outcome: Progressing   Problem: Health Behavior/Discharge Planning: Goal: Ability to manage health-related needs will improve Outcome: Progressing   Problem: Clinical Measurements: Goal: Ability to maintain clinical measurements within normal limits will improve Outcome: Progressing Goal: Will remain free from infection Outcome: Progressing Goal: Diagnostic test results will improve Outcome: Progressing Goal: Respiratory complications will improve Outcome: Progressing Goal: Cardiovascular complication will be avoided Outcome: Progressing   Problem: Activity: Goal: Risk for activity intolerance will decrease Outcome: Progressing   Problem: Nutrition: Goal: Adequate nutrition will be maintained Outcome: Progressing   Problem: Coping: Goal: Level of anxiety will decrease Outcome: Progressing   Problem: Elimination: Goal: Will not experience complications related to bowel motility Outcome: Progressing Goal: Will not experience complications related to urinary retention Outcome: Progressing   Problem: Pain Managment: Goal: General experience of comfort will improve Outcome: Progressing   Problem: Safety: Goal: Ability to remain free from injury will improve Outcome: Progressing   Problem: Skin Integrity: Goal: Risk for impaired skin integrity will decrease Outcome: Progressing

## 2019-04-25 NOTE — Progress Notes (Addendum)
NAME:  Spencer Richard, MRN:  [id number], DOB:  1960-10-01, LOS: 3 ADMISSION DATE:  04/11/2019, CONSULTATION DATE:  11/17 REFERRING MD:  TRH, CHIEF COMPLAINT:  dyspnea   Brief History   Corrected age and identity of 58 y/o alcoholic male with COVID 19 pneumonia, intubated in setting of encephalopathy and respiratory failure on 11/17. (was identified as Spencer Richard [phone] M on admission)  Past Medical History  HTN  Significant Hospital Events   11/15-Admit 11/17- Intubation for respiratory failure, severe bradycardia on precedex requiring atropine 11/21- Persistent fevers, Rapid afib, started amio 11/22- Converted to NSR 11/24-scrotal edema concerning for gas-forming severe epididymorchitis   Consults:  PCCM Urology  Nephrology   Procedures:  ETT 11/17 >  R IJ CVL 11/21>  trialysis 11/25>  Significant Diagnostic Tests:  11/24 scrotal US "Soft tissue swelling in particularly the LEFT hemiscrotum associated with numerous bright echogenic foci which demonstrate posterior acoustic shadowing consistent with gas, representing Fournier's Gangrene."  Micro Data:  COVID 19 positive Blood cx 11/15>>>NTD Tracheal aspirate 11/21>Klebsiella, ampicillin resistant Blood 11/24>>  Antimicrobials:  Remdisivir 11/15>>> 11/19 Rocephin 11/15>>> 11/20 Zithromax 11/16>>> 11/20  Cefepime 11/21 >>11/24 Decadron 11/16 (note that d/t pt being identity clarified 11/24, decadron order "fell off" from chart merge so 3 more doses ordered 11/25) >>  Meropenem 11/24>> Vancomycin 11/24>>  Interim history/subjective:  Significant vent dyssynchrony causing issues with CRRT flows.  Scrotum worse per nurses.   Objective   Blood pressure 108/75, pulse (!) 115, temperature (!) 97.4 F (36.3 C), temperature source Oral, resp. rate (!) 26, height 5\' 3"  (1.6 m), weight 103.6 kg, SpO2 95 %.    Vent Mode: PRVC FiO2 (%):  [40 %] 40 % Set Rate:  [26 bmp] 26 bmp Vt Set:  [450 mL] 450 mL PEEP:  [5 cmH20] 5  cmH20 Plateau Pressure:  [14 cmH20-20 cmH20] 17 cmH20   Intake/Output Summary (Last 24 hours) at 04/25/2019 1400 Last data filed at 04/25/2019 1300 Gross per 24 hour  Intake 5825.57 ml  Output 5366 ml  Net 459.57 ml   Filed Weights   04/23/19 0425 04/24/19 0423 04/25/19 0500  Weight: 99.5 kg 102 kg 103.6 kg    Examination:   General: Critically ill-appearing man lying in bed in no acute distress HENT: [place]/AT, eyes anicteric Neck: L IJ trialysis. CV: Regular rate and rhythm, no murmurs  lungs: Very tachypneic with abdominal accessory muscle use during exhalation, no wheezing on exam. ABD: Soft during inhalation GU: Foley in place, significantly swollen penis and scrotum.  Scrotum indurated and darkly discolored, no external wounds.  No significant extension onto the thighs or suprapubic area. EXT: Pitting edema in extremities, no clubbing Skin: Warm, dry Neuro: RASS -5   11/26 KUB-no free air, no obvious ileus.  Resolved Hospital Problem list     Assessment & Plan:  ARDS 2/2 COVID19 Pneumonia: Continues to have slow improvement -Continue low tidal volume ventilation, 6 to 8 cc/kg ideal body weight with goal plateau less than 30 and driving pressure less than 15.  Please follow the ARDS ladder for PEEP and FiO2 titration.  Titrate down FiO2 as able -Daily SAT and SBT when appropriate -Paralytics as required for vent synchrony -Titrate sedation to tolerate mechanical ventilation and pain control -VAP prevention protocol -Continue treatment for Klebsiella to complete 7 days -Continue Decadron for 10 days  Paroxysmal rapid atrial fibrillation, periodic RVR -Continue amiodarone infusion -Continue metoprolol as needed -Adequate pain control   Shock, distributive due to sepsis and sedation. -  Vasopressors as required to maintain MAP greater than 65 -Continue antibiotics -Appreciate urology's assistance; he may require intervention for definitive source control  AKI-  multifactorial including medications, septic shock, hypoperfusion.  Discontinued Latuda due to same. -Continue CRRT per nephrology -Sedation and paralytics as required for vent synchrony to facilitate CRRT  Hypernatremia.  5.5 L free water deficit -CRRT, free water flushes  Septic shock 2/2 Severe Epididymorchitis / Gas-Forming-febrile, tachycardic, decreasing PLT -Continue broad-spectrum antibiotics-meropenem and vancomycin -Appreciate urology's input -Continue to follow blood cultures -Continue vasopressors as required to maintain MAP greater than 65 -Continue fentanyl for pain control  Klebsiella pneumonia: Probable HAP -Continue meropenem  EtOH withdrawal: Continues to require Versed infusion for sedation, vent synchrony agitation -Continue fentanyl and Versed for sedation -Continue supplemental vitamins   Elevated blood sugar with A1c 6.7-previously undiagnosed diabetes mellitus -Continue Accu-Cheks every 4 hours with sliding scale insulin as needed -Goal BG 140-180 while admitted to the ICU  FEN -Tube feeds  Best practice:  Diet: tube feeding Pain/Anxiety/Delirium protocol (if indicated): as above VAP protocol (if indicated): yes DVT prophylaxis: Heparin due to worsening renal function GI prophylaxis: PPI Glucose control: SSI  Mobility: bed rest Code Status: FULL  Family Communication: will update Disposition: remain in ICU  Labs  Reviewed in EMR    This patient is critically ill with multiple organ system failure which requires frequent high complexity decision making, assessment, support, evaluation, and titration of therapies. This was completed through the application of advanced monitoring technologies and extensive interpretation of multiple databases. During this encounter critical care time was devoted to patient care services described in this note for 50 minutes.  Julian Hy, DO 04/25/19 2:12 PM [facility]    Updated nephew  Korol. All questions answered.  Julian Hy, DO 04/25/19 4:56 PM [facility]

## 2019-04-25 NOTE — Progress Notes (Addendum)
PROGRESS NOTE    Spencer Richard  ZOX:[id number] DOB: [birth year] DOA: Apr 26, 2019 PCP: Patient, No Pcp Per   Brief Narrative:   58 y.o. male PMHx depression, anxiety, HTN,  morbid obesity,  Presented with fever, shortness of breath and cough for the last 5 days.  Associated with diaphoresis.  Patient was brought to the ER on nonrebreather bag by EMS.  He was found to have a temperature 103 on arrival and was very tachypneic.  His evaluation showed bilateral infiltrates consistent with Covid pneumonia and subsequent testing shows COVID-19 positive.  Patient has improvement currently titrated to oxygen by nasal cannula.  He denied any knowledge of close contacts with Covid.  He denied any dizziness.  No nausea vomiting or diarrhea.  His oxygen sat originally was in the 70s.  That was earlier in the day when he went to urgent care center.  He looks comfortable now but due to Covid pneumonia with oxygen demand patient is being admitted to the hospital for treatment.   Subjective: Last 24 hours T-max 38.1 C, patient dyssynchronous on vent increased Versed+ fentanyl without effect.  Patient sedated/intubated   Assessment & Plan:   Active Problems:   Pneumonia   Pneumonia due to COVID-19 virus   Acute respiratory failure with hypoxia (HCC)   Acute respiratory distress syndrome (ARDS) due to COVID-19 virus (HCC)   Klebsiella pneumonia (HCC)   HCAP (healthcare-associated pneumonia)   AF (paroxysmal atrial fibrillation) (HCC)   Acute renal failure (ARF) (HCC)   Hypotension   Severe sepsis with septic shock (HCC)   Alcohol withdrawal (HCC)   Prediabetes  Covid pneumonia/acute respiratory failure with hypoxia/ARDS -Mechanical ventilation per ARDS protocol -Tidal volume 6 to 8 cc/kg IBW -Target plateau pressure<30 cm H2O -Target driving [id number]<29 cm F6O -Prone 16 hours/day if PF ratio<150 -11/26 patient dyssynchronous on vent despite increase in Versed+ fentanyl -11/26 start vecuronium PRN  (PCCM concurs)  Klebsiella pneumonia/HCAP -Complete 7 days of meropenem -See ARDS  Paroxysmal atrial fibrillation with RVR -Amiodarone infusion -Metoprolol PRN -11/26 currently NSR  Hypotension -Multifactorial secondary to sedation, septic shock -Continue Levophed to maintain MAP > 65  Acute renal failure -Multifactorial septic shock, hypoperfusion -Latuda discontinued secondary to acute renal failure -Continue CRRT per nephrology -Strict in and out +3.2 L -Daily weight Filed Weights   04/23/19 0425 04/24/19 0423 04/25/19 0500  Weight: 99.5 kg 102 kg 103.6 kg  -11/26 pulling 43ml/hr and patient producing 30-69ml/hr Recent Labs  Lab 04/23/19 0350 04/23/19 1540 04/24/19 0456 04/25/19 0545 04/25/19 0958  CREATININE 3.10* 3.71* 4.27* 2.80* 2.41*    Hypernatremia -Resolved -Free water TID Continue free water  Septic shock/severe Epididymorchitis/Gas Forming -Per EMR Urology consulted 11/24-do not suspect Fournier's at this time -Currently patient's abdomen taut with hypoactive bowel sounds, ileus vs SBO vs extension of infection. -11/26 stat KUB negative for signs of advancing infection, will hold on abdominal CT.   EtOH withdrawal -Patient currently sedated and intubated -Continue thiamine and folic acid  Prediabetes -11/24 hemoglobin A1c= 6.7 -New if patient survives will consult diabetic coordinator and nutrition for diabetic counseling. -Moderate SSI    DVT prophylaxis: Subcu heparin Code Status: Full Family Communication:  Disposition Plan: TBD   Consultants:  PCCM Urology  Nephrology     Procedures/Significant Events:  11/15-Admit 11/17- Intubation for respiratory failure, severe bradycardia on precedex requiring atropine 11/17 ETT placed 11/21 RIGHT IJ CVL placed 11/21- Persistent fevers, Rapid afib, started amio 11/22- Converted to NSR 11/24 US Scrotum;  edema concerning for  gas-forming severe epididymorchitis 11/26 KUB;  negative ileus or SBO.     I have personally reviewed and interpreted all radiology studies and my findings are as above.  VENTILATOR SETTINGS: Vent settings; PRVC Rate set; 26 Vt Set; 450 PEEP; 5 FiO2; 40% SPO2; 99%     Cultures COVID 19 positive Blood cx 11/15>>>NTD Tracheal aspirate 11/21>Klebsiella, ampicillin resistant Blood 11/24>>    Antimicrobials: Anti-infectives (From admission, onward)   Start     Stop   04/25/19 1900  vancomycin (VANCOCIN) IVPB 1000 mg/200 mL premix  Status:  Discontinued     04/24/19 1922   04/24/19 2200  vancomycin (VANCOCIN) IVPB 1000 mg/200 mL premix         04/24/19 1027  ceFEPIme (MAXIPIME) 2 g in sodium chloride 0.9 % 100 mL IVPB  Status:  Discontinued     04/23/19 1705   04/24/19 0600  meropenem (MERREM) 1 g in sodium chloride 0.9 % 100 mL IVPB         04/23/19 1900  vancomycin (VANCOCIN) 1,500 mg in sodium chloride 0.9 % 500 mL IVPB     04/23/19 2044   04/23/19 1800  meropenem (MERREM) 2 g in sodium chloride 0.9 % 100 mL IVPB     04/23/19 1859   04/01/2019 2200  ceFEPIme (MAXIPIME) 2 g in sodium chloride 0.9 % 100 mL IVPB  Status:  Discontinued     04/23/19 1518       Devices    LINES / TUBES:  ETT 11/17 >  R IJ CVL 11/21>     Continuous Infusions: .  prismasol BGK 4/2.5 400 mL/hr at 04/25/19 0744  .  prismasol BGK 4/2.5 200 mL/hr at 04/24/19 1844  . sodium chloride 10 mL/hr at 04/25/19 1300  . amiodarone 30 mg/hr (04/25/19 1300)  . feeding supplement (VITAL AF 1.2 CAL) 1,000 mL (04/25/19 1305)  . fentaNYL infusion INTRAVENOUS 250 mcg/hr (04/25/19 1300)  . heparin 10,000 units/ 20 mL infusion syringe 750 Units/hr (04/25/19 0641)  . meropenem (MERREM) IV Stopped (04/25/19 0631)  . midazolam 4 mg/hr (04/25/19 1300)  . phenylephrine (NEO-SYNEPHRINE) Adult infusion 5 mcg/min (04/25/19 1327)  . prismasol BGK 4/2.5 2,000 mL/hr at 04/25/19 1324  . vancomycin Stopped (04/24/19 2223)     Objective: Vitals:   04/25/19  1230 04/25/19 1245 04/25/19 1300 04/25/19 1315  BP: 101/80 100/69 96/81 108/75  Pulse:    (!) 115  Resp: (!) 30 (!) 26 (!) 26 (!) 26  Temp:      TempSrc:      SpO2:    95%  Weight:      Height:        Intake/Output Summary (Last 24 hours) at 04/25/2019 1401 Last data filed at 04/25/2019 1300 Gross per 24 hour  Intake 5825.57 ml  Output 5366 ml  Net 459.57 ml   Filed Weights   04/23/19 0425 04/24/19 0423 04/25/19 0500  Weight: 99.5 kg 102 kg 103.6 kg    Examination:  General: Intubated/sedated, positive acute respiratory distress Eyes: negative scleral hemorrhage, negative anisocoria, negative icterus ENT: Negative Runny nose, negative gingival bleeding, Neck:  Negative scars, masses, torticollis, lymphadenopathy, JVD Lungs: Tachypneic clear to auscultation bilaterally without wheezes or crackles Cardiovascular: Tachycardic without murmur gallop or rub normal S1 and S2 Abdomen: negative abdominal pain, positive distention, positive firm abdomen l, hypoactive-absent soft, bowel sounds, no rebound, no ascites, no appreciable mass Extremities: No significant cyanosis, clubbing, or edema bilateral lower extremities Skin: Negative rashes, lesions, ulcers Psychiatric: Intubated/sedated  Central nervous system: Intubated/sedated .     Data Reviewed: Care during the described time interval was provided by me .  I have reviewed this patient's available data, including medical history, events of note, physical examination, and all test results as part of my evaluation.   CBC: Recent Labs  Lab 04/19/19 0337 04/20/19 0330 04/21/19 0502  04/24/2019 0455 04/23/19 1121 04/24/19 0456 04/25/19 0545 04/25/19 0958 04/25/19 1220  WBC 10.6* 19.3* 31.2*  --  21.2*  --  16.0* 12.2* 13.2*  --   NEUTROABS 8.6* 16.8*  --   --   --   --  13.5*  --  11.1*  --   HGB 10.4* 12.3* 12.0*   < > 9.7* 9.9* 8.9* 9.2* 9.5* 10.9*  HCT 37.1* 44.5 42.8   < > 33.9* 29.0* 32.2* 32.2* 33.5* 32.0*  MCV  73.0* 73.7* 74.0*  --  72.9*  --  74.9* 71.7* 71.6*  --   PLT 531* 631* 716*  --  570*  --  280 257 257  --    < > = values in this interval not displayed.   Basic Metabolic Panel: Recent Labs  Lab 04/21/19 0502  04/21/2019 0455 04/23/19 0350 04/23/19 0414  04/23/19 1540 04/24/19 0456 04/25/19 0545 04/25/19 0958 04/25/19 1220  NA 146*   < > 150* 153*  --    < > 151* 152* 143 143 143  K 4.2   < > 4.4 5.0  --    < > 4.7 4.9 4.2 4.3 4.0  CL 108  --  114* 115*  --   --  114* 117* 109 108  --   CO2 28  --  26 25  --   --  23 23 24 24   --   GLUCOSE 181*  --  142* 134*  --   --  114* 167* 180* 139*  --   BUN 66*  --  91* 107*  --   --  136* 143* 96* 85*  --   CREATININE 1.17  --  2.33* 3.10*  --   --  3.71* 4.27* 2.80* 2.41*  --   CALCIUM 8.3*  --  8.0* 8.0*  --   --  7.3* 6.9* 7.3* 6.6*  --   MG 3.0*  --  2.9*  --  3.2*  --   --  2.7* 2.6* 2.3  --   PHOS 4.2  --  4.3  --   --   --  5.8*  --  4.0 3.8  --    < > = values in this interval not displayed.   GFR: Estimated Creatinine Clearance: 35.3 mL/min (A) (by C-G formula based on SCr of 2.41 mg/dL (H)). Liver Function Tests: Recent Labs  Lab 04/19/19 0337 04/20/19 0330 04/23/19 1540 04/25/19 0545 04/25/19 0958  AST 36 44*  --   --  38  ALT 28 34  --   --  22  ALKPHOS 90 107  --   --  128*  BILITOT 0.7 0.8  --   --  0.4  PROT 6.7 7.5  --   --  5.9*  ALBUMIN 2.5* 2.6* 1.8* 1.5* 1.5*   No results for input(s): LIPASE, AMYLASE in the last 168 hours. No results for input(s): AMMONIA in the last 168 hours. Coagulation Profile: No results for input(s): INR, PROTIME in the last 168 hours. Cardiac Enzymes: No results for input(s): CKTOTAL, CKMB, CKMBINDEX, TROPONINI in the last 168 hours. BNP (last  3 results) No results for input(s): PROBNP in the last 8760 hours. HbA1C: Recent Labs    04/23/19 0350  HGBA1C 6.7*   CBG: Recent Labs  Lab 04/24/19 1942 04/24/19 2330 04/25/19 0323 04/25/19 0752 04/25/19 1200  GLUCAP 183*  164* 143* 157* 132*   Lipid Profile: No results for input(s): CHOL, HDL, LDLCALC, TRIG, CHOLHDL, LDLDIRECT in the last 72 hours. Thyroid Function Tests: No results for input(s): TSH, T4TOTAL, FREET4, T3FREE, THYROIDAB in the last 72 hours. Anemia Panel: No results for input(s): VITAMINB12, FOLATE, FERRITIN, TIBC, IRON, RETICCTPCT in the last 72 hours. Urine analysis:    Component Value Date/Time   COLORURINE YELLOW 04/24/2019 1758   APPEARANCEUR CLOUDY (A) 04/24/2019 1758   LABSPEC 1.013 04/24/2019 1758   PHURINE 5.0 04/24/2019 1758   GLUCOSEU NEGATIVE 04/24/2019 1758   HGBUR MODERATE (A) 04/24/2019 1758   BILIRUBINUR NEGATIVE 04/24/2019 1758   KETONESUR NEGATIVE 04/24/2019 1758   PROTEINUR 30 (A) 04/24/2019 1758   NITRITE NEGATIVE 04/24/2019 1758   LEUKOCYTESUR NEGATIVE 04/24/2019 1758   Sepsis Labs: @LABRCNTIP (procalcitonin:4,lacticidven:4)  ) Recent Results (from the past 240 hour(s))  Culture, blood (routine x 2)     Status: None (Preliminary result)   Collection Time: 04/20/19  9:28 AM   Specimen: BLOOD  Result Value Ref Range Status   Specimen Description   Final    BLOOD RIGHT HAND Performed at [facility], [address] [place], [place]    Special Requests   Final    BOTTLES DRAWN AEROBIC ONLY Blood Culture adequate volume Performed at [facility], [address] [place], [place]    Culture   Final    NO GROWTH 2 DAYS Performed at [facility], [address], [place], [place]    Report Status PENDING  Incomplete  Culture, blood (routine x 2)     Status: None (Preliminary result)   Collection Time: 04/20/19  9:32 AM   Specimen: BLOOD  Result Value Ref Range Status   Specimen Description   Final    BLOOD RIGHT ANTECUBITAL Performed at [facility], [address], [place], [place]    Special Requests   Final    BOTTLES DRAWN AEROBIC ONLY Blood  Culture adequate volume Performed at [facility], [address], [place], [place]    Culture   Final    NO GROWTH 2 DAYS Performed at [facility], [address] [place], [place]    Report Status PENDING  Incomplete  Culture, respiratory (non-expectorated)     Status: None   Collection Time: 04/20/19  9:47 AM   Specimen: Tracheal Aspirate; Respiratory  Result Value Ref Range Status   Specimen Description   Final    TRACHEAL ASPIRATE Performed at [facility], [address] [place], [place]    Special Requests   Final    Normal Performed at [facility], [address], [place], [place]    Gram Stain   Final    ABUNDANT WBC PRESENT, PREDOMINANTLY PMN NO ORGANISMS SEEN Performed at [facility], [address] [place], [place]    Culture RARE KLEBSIELLA PNEUMONIAE  Final   Report Status 04/11/2019 FINAL  Final   Organism ID, Bacteria KLEBSIELLA PNEUMONIAE  Final      Susceptibility   Klebsiella pneumoniae - MIC*    AMPICILLIN >=32 RESISTANT Resistant  CEFAZOLIN <=4 SENSITIVE Sensitive     CEFEPIME <=1 SENSITIVE Sensitive     CEFTAZIDIME <=1 SENSITIVE Sensitive     CEFTRIAXONE <=1 SENSITIVE Sensitive     CIPROFLOXACIN <=0.25 SENSITIVE Sensitive     GENTAMICIN <=1 SENSITIVE Sensitive     IMIPENEM <=0.25 SENSITIVE Sensitive     TRIMETH/SULFA <=20 SENSITIVE Sensitive     AMPICILLIN/SULBACTAM 8 SENSITIVE Sensitive     PIP/TAZO <=4 SENSITIVE Sensitive     Extended ESBL NEGATIVE Sensitive     * RARE KLEBSIELLA PNEUMONIAE  Culture, blood (routine x 2)     Status: None (Preliminary result)   Collection Time: 04/23/19  6:14 PM   Specimen: BLOOD  Result Value Ref Range Status   Specimen Description   Final    BLOOD LEFT ANTECUBITAL Performed at [facility], [address], [place], [place] [address]    Special Requests   Final     BOTTLES DRAWN AEROBIC ONLY Blood Culture results may not be optimal due to an inadequate volume of blood received in culture bottles Performed at [facility], [address] [place], [place] [address]    Culture   Final    NO GROWTH 2 DAYS Performed at [facility], [address] [place], [place] [address]    Report Status PENDING  Incomplete  Culture, blood (routine x 2)     Status: None (Preliminary result)   Collection Time: 04/23/19  6:22 PM   Specimen: BLOOD LEFT HAND  Result Value Ref Range Status   Specimen Description   Final    BLOOD LEFT HAND Performed at [facility], [address] [place], [place] [address]    Special Requests   Final    BOTTLES DRAWN AEROBIC ONLY Blood Culture adequate volume Performed at [facility], [address], [place], [place] [address]    Culture   Final    NO GROWTH 2 DAYS Performed at [facility], [address], [place], [place] [address]    Report Status PENDING  Incomplete         Radiology Studies: Dg Abd 1 View  Result Date: 04/25/2019 CLINICAL DATA:  Ileus. EXAM: ABDOMEN - 1 VIEW COMPARISON:  None. FINDINGS: Nasogastric tube extends through the stomach and into the region of the duodenal bulb/proximal descending duodenum. Bowel gas pattern demonstrates no significant dilated bowel loops or obstructive pattern. No gross signs of free air. No abnormal calcifications. IMPRESSION: Nasogastric tube tip lies in the region of the duodenal bulb/proximal descending duodenum. Normal bowel gas pattern. Electronically Signed   By: Irish Lack M.D.   On: 04/25/2019 11:25   US Scrotum  Result Date: 04/23/2019 CLINICAL DATA:  Scrotal edema EXAM: ULTRASOUND OF SCROTUM TECHNIQUE: Complete ultrasound examination of the testicles, epididymis, and other scrotal structures was performed. COMPARISON:  None FINDINGS: Right testicle Measurements: 3.9 x 1.7 x 2.9 cm.  Normal  morphology without mass. Left testicle Measurements: 3.5 x 1.9 x 2.7 cm. Mildly inhomogeneous echogenicity without mass. Right epididymis:  Normal in size and appearance. Left epididymis:  Mildly edematous. Hydrocele:  None visualized. Varicocele: None visualized Other findings: Numerous tiny foci of bright echogenicity throughout the soft tissues in the LEFT hemiscrotum associated with posterior shadowing and soft tissue swelling. Finding is most consistent with numerous foci of gas within the soft tissues and a diagnosis of Fournier's gangrene. IMPRESSION: Normal appearing testes and epididymi. Soft tissue swelling in particularly the LEFT hemiscrotum  associated with numerous bright echogenic foci which demonstrate posterior acoustic shadowing consistent with gas, representing Fournier's gangrene. Critical Value/emergent results were called by telephone at the time of interpretation on 04/23/2019 at 1658 hrs to Thurman NP, who verbally acknowledged these results. Electronically Signed   By: Lavonia Dana M.D.   On: 04/23/2019 17:01   Dg Chest Port 1 View  Result Date: 04/24/2019 CLINICAL DATA:  Central line placement EXAM: PORTABLE CHEST 1 VIEW COMPARISON:  April 24, 2019 FINDINGS: The endotracheal tube terminates above the carina by approximately 4.7 cm. There is a left-sided central venous catheter with tip terminating over the proximal SVC. There is a right-sided central venous catheter with tip terminating over the mid to distal SVC. Multifocal airspace opacities are again noted with some interval progression from the prior study. The heart size remains enlarged. There is no pneumothorax. No large pleural effusion. IMPRESSION: 1. Lines and tubes as above.  No pneumothorax. 2. Persistent diffuse bilateral pulmonary opacities which may be secondary to multifocal pneumonia or pulmonary edema. 3. Cardiomegaly. Electronically Signed   By: Constance Holster M.D.   On: 04/24/2019 18:34   Dg  Chest Port 1 View  Result Date: 04/24/2019 CLINICAL DATA:  Acute respiratory failure. EXAM: PORTABLE CHEST 1 VIEW COMPARISON:  No prior. FINDINGS: Endotracheal tube noted with tip 3 cm above the carina. NG tube noted with tip below left hemidiaphragm. Right IJ line noted with tip over SVC. Cardiomegaly. Diffuse bilateral pulmonary infiltrates and/or edema. Bibasilar atelectasis. No pleural effusion or pneumothorax. IMPRESSION: 1.  Lines and tubes in good anatomic position.  No pneumothorax. 2.  Cardiomegaly.  No pulmonary venous congestion. 3. Diffuse bilateral pulmonary infiltrates and/or edema. Low lung volumes with bibasilar atelectasis. Electronically Signed   By: Marcello Moores  Register   On: 04/24/2019 08:03        Scheduled Meds: . artificial tears  1 application Both Eyes X1G  . chlorhexidine  15 mL Mouth/Throat BID  . Chlorhexidine Gluconate Cloth  6 each Topical Daily  . dexamethasone (DECADRON) injection  6 mg Intravenous Q24H  . folic acid  1 mg Per Tube Daily  . free water  300 mL Per Tube Q8H  . heparin injection (subcutaneous)  7,500 Units Subcutaneous Q8H  . insulin aspart  0-15 Units Subcutaneous Q4H  . ipratropium-albuterol  3 mL Nebulization Q4H  . mouth rinse  15 mL Mouth Rinse 10 times per day  . mirtazapine  15 mg Per Tube QHS  . multivitamin  15 mL Per Tube Daily  . pantoprazole sodium  40 mg Per Tube Daily  . sodium chloride flush  10-40 mL Intracatheter Q12H  . thiamine injection  100 mg Intravenous Daily   Or  . thiamine  100 mg Per Tube Daily   Continuous Infusions: .  prismasol BGK 4/2.5 400 mL/hr at 04/25/19 0744  .  prismasol BGK 4/2.5 200 mL/hr at 04/24/19 1844  . sodium chloride 10 mL/hr at 04/25/19 1300  . amiodarone 30 mg/hr (04/25/19 1300)  . feeding supplement (VITAL AF 1.2 CAL) 1,000 mL (04/25/19 1305)  . fentaNYL infusion INTRAVENOUS 250 mcg/hr (04/25/19 1300)  . heparin 10,000 units/ 20 mL infusion syringe 750 Units/hr (04/25/19 0641)  . meropenem  (MERREM) IV Stopped (04/25/19 0631)  . midazolam 4 mg/hr (04/25/19 1300)  . phenylephrine (NEO-SYNEPHRINE) Adult infusion 5 mcg/min (04/25/19 1327)  . prismasol BGK 4/2.5 2,000 mL/hr at 04/25/19 1324  . vancomycin Stopped (04/24/19 2223)     LOS: 3 days  The patient is critically ill with multiple organ systems failure and requires high complexity decision making for assessment and support, frequent evaluation and titration of therapies, application of advanced monitoring technologies and extensive interpretation of multiple databases. Critical Care Time devoted to patient care services described in this note  Time spent: 40 minutes     Jkai Arwood, Roselind Messier, MD [facility] Pager [phone]  If 7PM-7AM, please contact night-coverage [email or web address] Password [email or web address] 04/25/2019, 2:01 PM

## 2019-04-26 ENCOUNTER — Inpatient Hospital Stay (HOSPITAL_COMMUNITY): Payer: HRSA Program

## 2019-04-26 DIAGNOSIS — N493 Fournier gangrene: Secondary | ICD-10-CM

## 2019-04-26 DIAGNOSIS — I82409 Acute embolism and thrombosis of unspecified deep veins of unspecified lower extremity: Secondary | ICD-10-CM

## 2019-04-26 DIAGNOSIS — F10230 Alcohol dependence with withdrawal, uncomplicated: Secondary | ICD-10-CM

## 2019-04-26 LAB — CBC WITH DIFFERENTIAL/PLATELET
Abs Immature Granulocytes: 0.37 10*3/uL — ABNORMAL HIGH (ref 0.00–0.07)
Basophils Absolute: 0.1 10*3/uL (ref 0.0–0.1)
Basophils Relative: 1 %
Eosinophils Absolute: 0 10*3/uL (ref 0.0–0.5)
Eosinophils Relative: 0 %
HCT: 31.7 % — ABNORMAL LOW (ref 39.0–52.0)
Hemoglobin: 9 g/dL — ABNORMAL LOW (ref 13.0–17.0)
Immature Granulocytes: 3 %
Lymphocytes Relative: 7 %
Lymphs Abs: 0.9 10*3/uL (ref 0.7–4.0)
MCH: 20.4 pg — ABNORMAL LOW (ref 26.0–34.0)
MCHC: 28.4 g/dL — ABNORMAL LOW (ref 30.0–36.0)
MCV: 71.7 fL — ABNORMAL LOW (ref 80.0–100.0)
Monocytes Absolute: 0.9 10*3/uL (ref 0.1–1.0)
Monocytes Relative: 7 %
Neutro Abs: 10.9 10*3/uL — ABNORMAL HIGH (ref 1.7–7.7)
Neutrophils Relative %: 82 %
Platelets: 218 10*3/uL (ref 150–400)
RBC: 4.42 MIL/uL (ref 4.22–5.81)
RDW: 21 % — ABNORMAL HIGH (ref 11.5–15.5)
WBC: 13.2 10*3/uL — ABNORMAL HIGH (ref 4.0–10.5)
nRBC: 0.4 % — ABNORMAL HIGH (ref 0.0–0.2)

## 2019-04-26 LAB — RENAL FUNCTION PANEL
Albumin: 1.6 g/dL — ABNORMAL LOW (ref 3.5–5.0)
Albumin: 1.8 g/dL — ABNORMAL LOW (ref 3.5–5.0)
Anion gap: 11 (ref 5–15)
Anion gap: 9 (ref 5–15)
BUN: 61 mg/dL — ABNORMAL HIGH (ref 6–20)
BUN: 64 mg/dL — ABNORMAL HIGH (ref 6–20)
CO2: 26 mmol/L (ref 22–32)
CO2: 26 mmol/L (ref 22–32)
Calcium: 7.9 mg/dL — ABNORMAL LOW (ref 8.9–10.3)
Calcium: 7.7 mg/dL — ABNORMAL LOW (ref 8.9–10.3)
Chloride: 104 mmol/L (ref 98–111)
Chloride: 104 mmol/L (ref 98–111)
Creatinine, Ser: 1.99 mg/dL — ABNORMAL HIGH (ref 0.61–1.24)
Creatinine, Ser: 1.98 mg/dL — ABNORMAL HIGH (ref 0.61–1.24)
GFR calc Af Amer: 41 mL/min — ABNORMAL LOW (ref >60)
GFR calc Af Amer: 42 mL/min — ABNORMAL LOW (ref >60)
GFR calc non Af Amer: 36 mL/min — ABNORMAL LOW (ref >60)
GFR calc non Af Amer: 36 mL/min — ABNORMAL LOW (ref >60)
Glucose, Bld: 124 mg/dL — ABNORMAL HIGH (ref 70–99)
Glucose, Bld: 134 mg/dL — ABNORMAL HIGH (ref 70–99)
Phosphorus: 4.7 mg/dL — ABNORMAL HIGH (ref 2.5–4.6)
Phosphorus: 4.3 mg/dL (ref 2.5–4.6)
Potassium: 4.3 mmol/L (ref 3.5–5.1)
Potassium: 4.5 mmol/L (ref 3.5–5.1)
Sodium: 141 mmol/L (ref 135–145)
Sodium: 139 mmol/L (ref 135–145)

## 2019-04-26 LAB — POCT I-STAT 7, (LYTES, BLD GAS, ICA,H+H)
Acid-base deficit: 1 mmol/L (ref 0.0–2.0)
Bicarbonate: 26.8 mmol/L (ref 20.0–28.0)
Calcium, Ion: 1.14 mmol/L — ABNORMAL LOW (ref 1.15–1.40)
HCT: 31 % — ABNORMAL LOW (ref 39.0–52.0)
Hemoglobin: 10.5 g/dL — ABNORMAL LOW (ref 13.0–17.0)
O2 Saturation: 93 %
Patient temperature: 98.3
Potassium: 4.2 mmol/L (ref 3.5–5.1)
Sodium: 139 mmol/L (ref 135–145)
TCO2: 29 mmol/L (ref 22–32)
pCO2 arterial: 58.2 mmHg — ABNORMAL HIGH (ref 32.0–48.0)
pH, Arterial: 7.27 — ABNORMAL LOW (ref 7.350–7.450)
pO2, Arterial: 78 mmHg — ABNORMAL LOW (ref 83.0–108.0)

## 2019-04-26 LAB — GLUCOSE, CAPILLARY
Glucose-Capillary: 117 mg/dL — ABNORMAL HIGH (ref 70–99)
Glucose-Capillary: 120 mg/dL — ABNORMAL HIGH (ref 70–99)
Glucose-Capillary: 127 mg/dL — ABNORMAL HIGH (ref 70–99)
Glucose-Capillary: 127 mg/dL — ABNORMAL HIGH (ref 70–99)
Glucose-Capillary: 130 mg/dL — ABNORMAL HIGH (ref 70–99)
Glucose-Capillary: 155 mg/dL — ABNORMAL HIGH (ref 70–99)
Glucose-Capillary: 175 mg/dL — ABNORMAL HIGH (ref 70–99)

## 2019-04-26 LAB — PHOSPHORUS: Phosphorus: 4.7 mg/dL — ABNORMAL HIGH (ref 2.5–4.6)

## 2019-04-26 LAB — COMPREHENSIVE METABOLIC PANEL
ALT: 24 U/L (ref 0–44)
AST: 40 U/L (ref 15–41)
Albumin: 1.6 g/dL — ABNORMAL LOW (ref 3.5–5.0)
Alkaline Phosphatase: 122 U/L (ref 38–126)
Anion gap: 11 (ref 5–15)
BUN: 62 mg/dL — ABNORMAL HIGH (ref 6–20)
CO2: 25 mmol/L (ref 22–32)
Calcium: 7.8 mg/dL — ABNORMAL LOW (ref 8.9–10.3)
Chloride: 103 mmol/L (ref 98–111)
Creatinine, Ser: 1.97 mg/dL — ABNORMAL HIGH (ref 0.61–1.24)
GFR calc Af Amer: 42 mL/min — ABNORMAL LOW (ref >60)
GFR calc non Af Amer: 36 mL/min — ABNORMAL LOW (ref >60)
Glucose, Bld: 119 mg/dL — ABNORMAL HIGH (ref 70–99)
Potassium: 4.3 mmol/L (ref 3.5–5.1)
Sodium: 139 mmol/L (ref 135–145)
Total Bilirubin: 0.4 mg/dL (ref 0.3–1.2)
Total Protein: 6.1 g/dL — ABNORMAL LOW (ref 6.5–8.1)

## 2019-04-26 LAB — HEPARIN LEVEL (UNFRACTIONATED): Heparin Unfractionated: <0.1 IU/mL — ABNORMAL LOW (ref 0.30–0.70)

## 2019-04-26 LAB — D-DIMER, QUANTITATIVE: D-Dimer, Quant: 3.31 ug/mL-FEU — ABNORMAL HIGH (ref 0.00–0.50)

## 2019-04-26 LAB — APTT: aPTT: 37 seconds — ABNORMAL HIGH (ref 24–36)

## 2019-04-26 LAB — C-REACTIVE PROTEIN: CRP: 24.5 mg/dL — ABNORMAL HIGH (ref <1.0)

## 2019-04-26 LAB — PROCALCITONIN: Procalcitonin: 9.38 ng/mL

## 2019-04-26 LAB — MAGNESIUM: Magnesium: 2.6 mg/dL — ABNORMAL HIGH (ref 1.7–2.4)

## 2019-04-26 MED ORDER — CLINDAMYCIN PHOSPHATE 900 MG/50ML IV SOLN
900.0000 mg | Freq: Three times a day (TID) | INTRAVENOUS | Status: DC
  Administered 2019-04-26 – 2019-04-27 (×3): 900 mg via INTRAVENOUS
  Filled 2019-04-26 (×5): qty 50

## 2019-04-26 MED ORDER — HEPARIN (PORCINE) 25000 UT/250ML-% IV SOLN
950.0000 [IU]/h | INTRAVENOUS | Status: DC
  Administered 2019-04-26: 400 [IU]/h via INTRAVENOUS
  Filled 2019-04-26: qty 250

## 2019-04-26 MED ORDER — AMIODARONE HCL 200 MG PO TABS
200.0000 mg | ORAL_TABLET | Freq: Two times a day (BID) | ORAL | Status: DC
  Administered 2019-04-26 – 2019-04-27 (×3): 200 mg
  Filled 2019-04-26 (×6): qty 1

## 2019-04-26 MED ORDER — FENTANYL CITRATE (PF) 2500 MCG/50ML IJ SOLN
0.0000 ug/h | INTRAMUSCULAR | Status: DC
  Administered 2019-04-26: 300 ug/h via INTRAVENOUS
  Administered 2019-04-27: 400 ug/h via INTRAVENOUS
  Administered 2019-04-27: 300 ug/h via INTRAVENOUS
  Filled 2019-04-26 (×4): qty 50

## 2019-04-26 NOTE — Progress Notes (Signed)
ANTICOAGULATION CONSULT NOTE   Pharmacy Consult for heparin Indication: VTE treatment  No Known Allergies  Patient Measurements: Height: 5\' 3"  (160 cm) Weight: 223 lb 8.7 oz (101.4 kg) IBW/kg (Calculated) : 56.9 Heparin Dosing Weight: 79 kg   Vital Signs: Temp: 97.7 F (36.5 C) (11/27 2100) Temp Source: Oral (11/27 2100) BP: 101/61 (11/27 2300) Pulse Rate: 64 (11/27 2300)  Labs: Recent Labs    04/25/19 0545 04/25/19 0958 04/25/19 1220 04/25/19 1547 04/26/19 0258 04/26/19 0445 04/26/19 1615 04/26/19 2024  HGB 9.2* 9.5* 10.9*  --  10.5* 9.0*  --   --   HCT 32.2* 33.5* 32.0*  --  31.0* 31.7*  --   --   PLT 257 257  --   --   --  218  --   --   APTT 46*  --   --   --   --  37*  --   --   HEPARINUNFRC 0.14*  --   --   --   --   --   --  <0.10*  CREATININE 2.80* 2.41*  --  2.37*  --  1.99*  1.97* 1.98*  --     Estimated Creatinine Clearance: 42.4 mL/min (A) (by C-G formula based on SCr of 1.98 mg/dL (H)).   Medical History: Past Medical History:  Diagnosis Date  . Anxiety   . Depression   . Diabetes mellitus, type 2 ([place]) 03/2019   A1C 6.7% 03/2019  . HTN (hypertension)   . Obesity     Medications:  No medications prior to admission.    Assessment: 55 YOM currently intubated secondary to COVID-19 pneumonia now with cool extremities concerning for DVT's. Pharmacy consulted to start IV heparin.   Heparin level undetectable on 400 units/hr systemic heparin + 750 units/hr CRRT circuit heparin. No issues with line or bleeding reported per RN.  Goal of Therapy:  Heparin level 0.3-0.7 units/ml Monitor platelets by anticoagulation protocol: Yes   Plan:  -Increase IV heparin to 750 units/hr. This in addition to IV heparin via CRRT circuit should equal 1500 units/hr  -Ideally we transition off CRRT heparin to only systemic heparin over next 24 hours or so -F/u 8 hr HL -Monitor daily HL, CBC and s/s of bleeding   Sherlon Handing, PharmD, BCPS Please see amion for  complete clinical pharmacist phone list 04/26/2019 11:31 PM

## 2019-04-26 NOTE — Progress Notes (Signed)
NAME:  Spencer Richard, MRN:  [id number], DOB:  02-May-1961, LOS: 4 ADMISSION DATE:  04/11/2019, CONSULTATION DATE:  11/17 REFERRING MD:  TRH, CHIEF COMPLAINT:  dyspnea   Brief History   Corrected age and identity of 58 y/o alcoholic male with COVID 19 pneumonia, intubated in setting of encephalopathy and respiratory failure on 11/17. (was identified as Spencer Richard [phone] M on admission)  Past Medical History  HTN  Significant Hospital Events   11/15-Admit 11/17- Intubation for respiratory failure, severe bradycardia on precedex requiring atropine 11/21- Persistent fevers, Rapid afib, started amio 11/22- Converted to NSR 11/24-scrotal edema concerning for gas-forming severe epididymorchitis   Consults:  PCCM Urology  Nephrology   Procedures:  ETT 11/17 >  R IJ CVL 11/21>  Trialysis L IJ 11/25>  Significant Diagnostic Tests:  11/24 scrotal US "Soft tissue swelling in particularly the LEFT hemiscrotum associated with numerous bright echogenic foci which demonstrate posterior acoustic shadowing consistent with gas, representing Fournier's Gangrene."  11/27 CT AP Extensive scrotal edema and florid gas formation within the left scrotum, tracking up through the inguinal canal, into the subcutaneous soft tissues of the inguinal fold, and into the abdominal cavity, a 5.5 x 4.4 x 2.5 cm air and fluid collection about the sigmoid colon and left testicular vein measuring 5.5 x 4.4 cm. Findings remain consistent with Fournier's gangrene as previously seen by scrotal ultrasound, now with intra-abdominal extent demonstrated by CT.  11/27 Renal US Normal appearing kidneys.   Micro Data:  COVID 19 positive Blood cx 11/15>>>NTD Tracheal aspirate 11/21>Klebsiella, ampicillin resistant Blood 11/24>>  Antimicrobials:  Remdisivir 11/15>>> 11/19 Rocephin 11/15>>> 11/20 Zithromax 11/16>>> 11/20  Cefepime 11/21 >>11/24 Decadron 11/16 (note that d/t pt being identity clarified 11/24,  decadron order "fell off" from chart merge so 3 more doses ordered 11/25) >>  Meropenem 11/24>> Vancomycin 11/24>>  Interim history/subjective:  Extensive left scrotal swelling with induration and erythema extending left inguinal area to lower abdomen. Off pressors. On 40% FiO2. Periods of A. fib with RVR, self-limiting overnight and improved, back in sinus rhythm this morning.   Objective   Blood pressure 101/73, pulse (!) 108, temperature 97.9 F (36.6 C), temperature source Oral, resp. rate (!) 26, height 5\' 3"  (1.6 m), weight 101.4 kg, SpO2 93 %.    Vent Mode: PRVC FiO2 (%):  [40 %] 40 % Set Rate:  [26 bmp] 26 bmp Vt Set:  [450 mL] 450 mL PEEP:  [5 cmH20] 5 cmH20 Plateau Pressure:  [16 cmH20-19 cmH20] 18 cmH20   Intake/Output Summary (Last 24 hours) at 04/26/2019 0818 Last data filed at 04/26/2019 0700 Gross per 24 hour  Intake 4068.87 ml  Output 5763 ml  Net -1694.13 ml   Filed Weights   04/24/19 0423 04/25/19 0500 04/26/19 0500  Weight: 102 kg 103.6 kg 101.4 kg    Examination:   General: Critically ill-appearing, well-developed male, no apparent distress. HENT: Normocephalic, PERRL.  Neck: No JVD. Trachea midline.  CV: RRR. S1S2. No MRG. +2 distal pulses Lungs: BBS managed bases, minimal rhonchi upper lobes, improved.  FNL, symmetrical.  Vent supported ABD: +BS x4. SNT/ND. No masses, guarding or rigidity GU: Left scrotal swelling, left scrotum firm to touch, induration noted, left inguinal space erythema and tracking up to left lower abdomen area firm to touch. EXT: Occasionally moves upper extremities spontaneously.  Generalized but improving edema Skin: PWD. In tact.  Neuro: Grimaces to pain, specifically scrotal assessment.  Withdraws x4.  Does not follow commands.  Resolved Hospital Problem list     Assessment & Plan:  Initial morning rounds plans included: ARDS 2/2 COVID19 Pneumonia: He has had slow improvement daily, weaned to 40% FiO2, 5 of  PEEP.  Continues to have periods of agitation with SBT. -Continue low tidal volume ventilation, 6 to 8 cc/kg ideal body weight with goal plateau less than 30 and driving pressure less than 15.  Please follow the ARDS ladder for PEEP and FiO2 titration.  Titrate down FiO2 as able -Daily SAT and SBT when appropriate -Paralytics as required for vent synchrony -Titrate sedation to tolerate mechanical ventilation and pain control -VAP prevention protocol -Continue treatment for Klebsiella -Continue Decadron  -See below  Paroxysmal rapid atrial fibrillation, periodic RVR.  Proved overnight.  In sinus rhythm this morning. -Change to amiodarone p.o. -Continue metoprolol as needed -Adequate pain control   Shock, distributive due to sepsis and sedation.  Low-dose pressors this morning, subsequently weaned off. -Vasopressors as required to maintain MAP greater than 65 -Continue antibiotics  AKI- multifactorial including medications, septic shock, hypoperfusion.  Discontinued Latuda due to same. Making good urine. -Continue CRRT per nephrology -Sent has clotted off.  Start systemic heparin and restart CRRT. -Sedation and paralytics as required for vent synchrony to facilitate CRRT  Klebsiella pneumonia  EtOH withdrawal: Continues to require Versed infusion for sedation, vent synchrony agitation -Continue fentanyl and Versed for sedation -Continue supplemental vitamins  Elevated blood sugar with A1c 6.7-previously undiagnosed diabetes mellitus -Continue Accu-Cheks every 4 hours with sliding scale insulin as needed -Goal BG 140-180 while admitted to the ICU  Hypernatremia. Resolved     After findings of CT AP:  Septic shock 2/2 Severe Epididymorchitis / Gas-Forming-with Fournier's gangrene now with abdominal extension. Abdominal exam revealed significant changes compared to prior, prompting CT of the abdomen and pelvis.  Results as noted above.  Discussed the case extensively with urology  who subsequently discussed the case with general surgery. Surgical approach would include scrotal exploration with likely left orchiectomy with an incision extending through the inguinal canal and into the left side of the abdomen.  This may include small and large bowel resection depending on the findings.  His abdomen would also likely remain open after this.  Surgery would likely be extensive and he would remain with a poor prognosis. Despite some improvement as noted above with reguards to respiratory failure, improving paroxysmal A. fib, minimal pressor requirements, and improvement in urine output, renal function it was felt that the patient would have poor prognosis whether he went through surgery or not.  Dr. Alvester MorinBell discussed this extensively with the patient's decision maker, nephew Spencer Richard.  After having some time to decide, Spencer Richard opted for comfort care only and does not wish to proceed with surgery. I spoke with Spencer Richard to clarify his understanding of this and that transitioning from treatment to comfort means that the patient would be compassionately weaned from the ventilator, discontinuing dialysis and any further treatments and that ultimately the patient would die.  Spencer Richard voices understanding and that he does not want his uncle to suffer anymore. He is to let us know if he wishes to come see his uncle prior to proceeding with comfort measures.  In the meantime will discontinue CRRT, any further lab testing, fingersticks, make patient DNR.  Will continue antibiotics until family ready to proceed and transition to comfort care focus.     Best practice:  Diet: NPO Pain/Anxiety/Delirium protocol (if indicated): as above VAP protocol (if indicated): yes DVT prophylaxis:  Heparin-will D/c  GI prophylaxis: PPI Glucose control: SSI  Mobility: bed rest Code Status: DNR Family Communication: as noted  Disposition: remain in ICU  Labs  Reviewed in EMR       The patient is critically  ill with respiratory failure and septic shock. he requires ICU for high complexity decision making, titration of high alert medications, ventilator management, titration of oxygen and interpretation of advanced monitoring.    I personally spent 70 minutes providing critical care services including personally reviewing test results, discussing care with nursing staff/other physicians and completing orders pertaining to this patient.  Time was exclusive to the patient and does not include time spent teaching or in procedures.  Voice recognition software was used in the production of this record.  Errors in interpretation may have been inadvertently missed during review.  Francine Graven, MSN, AGACNP  [facility]

## 2019-04-26 NOTE — Progress Notes (Signed)
Point of contact changed to sister Gilberto Better from [facility]. Dr. Carlis Abbott aware. Sister Gilberto Better to be only point of contact

## 2019-04-26 NOTE — Progress Notes (Signed)
I spoke with the patient's primary team today.  His scrotal and inguinal examination had progressed and worsened.  This prompted a CT scan of the abdomen and pelvis.  I personally reviewed this and it revealed extensive scrotal edema and a very large amount of gas formation in the left scrotum.  The gas is extending up the left inguinal canal and there is now gas present along the left testicular vein and a fluid collection around the sigmoid colon.  There was extensive extraluminal gas within the small bowel mesentery and mesocolon.    I extensively discussed this case with his primary team.  Overall prognosis is poor.  He continues on CRRT and is on heparin now because it continues to clot off.  He remains intubated and has some purposeful movement and has had a very small amount of improvement as evidenced by being off pressors.  However, he remains with multiorgan dysfunction.  Also extensively discussed his case with general surgery.  We discussed the surgical approach that we would take if the decision was made to proceed with surgery in an effort to stop the infection from spreading and contain the infection.  Surgical approach would include scrotal exploration with likely left orchiectomy with an incision extending through the inguinal canal and into the left side of the abdomen.  This may include small and large bowel resection depending on the findings.  His abdomen would also likely remain open after this.  Surgery would likely be extensive and he would remain with a poor prognosis.  I discussed all of this with his primary decision maker over the phone.  I discussed that without surgery it is unlikely that the infection will be controlled and his organ dysfunction will likely progress to death.  I discussed this in relation to possible surgical management.  I discussed that while we may be able to possibly control the infection with surgery, his outlook would remain poor.  He does have a poor  prognosis.  After full informed discussion of the option of surgery versus comfort care, the patient's primary decision-maker Spencer Richard has decided to proceed with comfort care and nonsurgical management.  Patient may continue antibiotics and other support but I think it is unlikely that his infection will become controlled with antibiotics alone.

## 2019-04-26 NOTE — Progress Notes (Signed)
PROGRESS NOTE    Spencer Richard  ZOX:[id number] DOB: May 12, 1961 DOA: 05-06-2019 PCP: Patient, No Pcp Per   Brief Narrative:   58 y.o. male PMHx depression, anxiety, HTN,  morbid obesity,  Presented with fever, shortness of breath and cough for the last 5 days.  Associated with diaphoresis.  Patient was brought to the ER on nonrebreather bag by EMS.  He was found to have a temperature 103 on arrival and was very tachypneic.  His evaluation showed bilateral infiltrates consistent with Covid pneumonia and subsequent testing shows COVID-19 positive.  Patient has improvement currently titrated to oxygen by nasal cannula.  He denied any knowledge of close contacts with Covid.  He denied any dizziness.  No nausea vomiting or diarrhea.  His oxygen sat originally was in the 70s.  That was earlier in the day when he went to urgent care center.  He looks comfortable now but due to Covid pneumonia with oxygen demand patient is being admitted to the hospital for treatment.   Subjective: 11/27 last 24 hours afebrile.  Intubated/sedated.  Per nursing has not required for vecuronium, for vent synchronization.  Now off Levophed    Assessment & Plan:   Active Problems:   Pneumonia   Pneumonia due to COVID-19 virus   Acute respiratory failure with hypoxia (HCC)   Acute respiratory distress syndrome (ARDS) due to COVID-19 virus (HCC)   Klebsiella pneumonia (HCC)   HCAP (healthcare-associated pneumonia)   AF (paroxysmal atrial fibrillation) (HCC)   Acute renal failure (ARF) (HCC)   Hypotension   Severe sepsis with septic shock (HCC)   Alcohol withdrawal (HCC)   Prediabetes  Covid pneumonia/acute respiratory failure with hypoxia/ARDS -Mechanical ventilation per ARDS protocol -Tidal volume 6 to 8 cc/kg IBW -Target plateau pressure<30 cm H2O -Target driving [id number]<29 cm F6O -Prone 16 hours/day if PF ratio<150 -11/26 patient dyssynchronous on vent despite increase in Versed+ fentanyl -11/26 start  vecuronium PRN (PCCM concurs)  Klebsiella pneumonia/HCAP -Complete 7 days of meropenem -See ARDS  Paroxysmal atrial fibrillation with RVR -Amiodarone infusion -Metoprolol PRN -11/26 currently NSR  Hypotension -Multifactorial secondary to sedation, septic shock -Continue Levophed to maintain MAP > 65  Acute renal failure -Multifactorial septic shock, hypoperfusion -Latuda discontinued secondary to acute renal failure -Continue CRRT per nephrology -Strict in and out +1.1 L -Daily weight Filed Weights   04/24/19 0423 04/25/19 0500 04/26/19 0500  Weight: 102 kg 103.6 kg 101.4 kg  -11/26 pulling 6ml/hr and patient producing 30-74ml/hr Recent Labs  Lab 04/25/19 0545 04/25/19 0958 04/25/19 1547 04/26/19 0445 04/26/19 1615  CREATININE 2.80* 2.41* 2.37* 1.99*   1.97* 1.98*    Hypernatremia -Resolved -Free water TID  Septic shock/severe Epididymorchitis/Gas Forming -Per EMR Urology consulted 11/24-do not suspect Fournier's at this time -Currently patient's abdomen taut with hypoactive bowel sounds, ileus vs SBO vs extension of infection. -11/26 stat KUB negative for signs of advancing infection, will hold on abdominal CT.  -11/27 worsening SWELLING OF SCROTAL SAC LEFT>> RIGHT with tracking up the inguinal canal.  Obtaining stat CT abdomen and pelvis. -Will reconsult Dr. Sebastian Ache urology; debridement?  Secure chat sent.   -ADDENDUM; per Dr. Modena Slater urology note he is spoken with family they would like to proceed with medical management ALONE.  NO SURGERY  EtOH withdrawal -Patient currently sedated and intubated -Continue thiamine and folic acid  Prediabetes -11/24 hemoglobin A1c= 6.7 -New if patient survives will consult diabetic coordinator and nutrition for diabetic counseling. -Moderate SSI    DVT prophylaxis: Subcu  heparin Code Status: Full Family Communication: 11/27 patient family was spoken to by Dr. Modena Slater urology, and PA Karin Lieu  PCCM.  See septic shock addendum Disposition Plan: TBD   Consultants:  PCCM Urology  Nephrology     Procedures/Significant Events:  11/15-Admit 11/17- Intubation for respiratory failure, severe bradycardia on precedex requiring atropine 11/17 ETT placed 11/21 RIGHT IJ CVL placed 11/21- Persistent fevers, Rapid afib, started amio 11/22- Converted to NSR 11/24 US Scrotum;  edema concerning for gas-forming severe epididymorchitis 11/26 KUB; negative ileus or SBO. 11/27 CT abdomen and pelvis; extensive scrotal edema and florid gas formation within the left scrotum, tracking up through the inguinal canal, into the subcutaneous soft tissues of the inguinal fold, and into the abdominal cavity, a 5.5 x 4.4 x 2.5 cm air and fluid collection about the sigmoid colon and left testicular vein measuring 5.5 x 4.4 cm. Findings remain consistent with Fournier's gangrene as previously seen by scrotal ultrasound, now with intra-abdominal extent demonstrated by CT. 2. Trace bilateral pleural effusions and associated atelectasis or consolidation of the included lung bases. 3. Esophagogastric tube is positioned with tip and side port below the diaphragm, tip within the descending duodenum. Consider retraction for intragastric tip position. 11/27 ultrasound scrotum Doppler;Air and fluid collection about the lateral aspect of the left testicle, predominantly obscured by shadowing air foci. Shadowing air foci similarly within the left groin. 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  2. Mild, nonspecific enlargement of the left epididymis. The testicles proper are normal in appearance with symmetric bilateral hyperemic Doppler flow.  3.  Extensive thickening of the scrotal wall. 11/27 renal ultrasound;    I have  personally reviewed and interpreted all radiology studies and my findings are as above.  VENTILATOR SETTINGS: Vent settings; PRVC 11/27 Rate set; 26 Vt Set; 450 PEEP; 5 FiO2; 40% SPO2; 94%     Cultures COVID 19 positive Blood cx 11/15>>>NTD Tracheal aspirate 11/21>Klebsiella, ampicillin resistant Blood 11/24>>    Antimicrobials: Anti-infectives (From admission, onward)   Start     Stop   04/25/19 1900  vancomycin (VANCOCIN) IVPB 1000 mg/200 mL premix  Status:  Discontinued     04/24/19 1922   04/24/19 2200  vancomycin (VANCOCIN) IVPB 1000 mg/200 mL premix         04/24/19 1027  ceFEPIme (MAXIPIME) 2 g in sodium chloride 0.9 % 100 mL IVPB  Status:  Discontinued     04/23/19 1705   04/24/19 0600  meropenem (MERREM) 1 g in sodium chloride 0.9 % 100 mL IVPB         04/23/19 1900  vancomycin (VANCOCIN) 1,500 mg in sodium chloride 0.9 % 500 mL IVPB     04/23/19 2044   04/23/19 1800  meropenem (MERREM) 2 g in sodium chloride 0.9 % 100 mL IVPB     04/23/19 1859   05/12/19 2200  ceFEPIme (MAXIPIME) 2 g in sodium chloride 0.9 % 100 mL IVPB  Status:  Discontinued     04/23/19 1518       Devices    LINES / TUBES:  ETT 11/17 >  R IJ CVL 11/21>     Continuous Infusions:   prismasol BGK 4/2.5 400 mL/hr at 04/26/19 1204    prismasol BGK 4/2.5 200 mL/hr at 04/26/19 1205   sodium chloride 10 mL/hr at 04/26/19 1900   clindamycin (  CLEOCIN) IV Stopped (04/26/19 1353)   feeding supplement (VITAL AF 1.2 CAL) 75 mL/hr at 04/26/19 1300   fentaNYL infusion INTRAVENOUS 300 mcg/hr (04/26/19 1900)   heparin 10,000 units/ 20 mL infusion syringe 750 Units/hr (04/26/19 1800)   heparin 400 Units/hr (04/26/19 1900)   meropenem (MERREM) IV Stopped (04/26/19 1849)   midazolam 3 mg/hr (04/26/19 1900)   phenylephrine (NEO-SYNEPHRINE) Adult infusion Stopped (04/25/19 2000)   prismasol BGK 4/2.5 2,000 mL/hr at 04/26/19 1452   vancomycin 1,000 mg (04/25/19 2316)      Objective: Vitals:   04/26/19 1815 04/26/19 1830 04/26/19 1845 04/26/19 1900  BP: (!) 92/64  Pulse:    63  Resp: 17 (!) 26 (!) 23 (!) 26  Temp: (!) 94.6 F (34.8 C)     TempSrc: Oral     SpO2:    98%  Weight:      Height:        Intake/Output Summary (Last 24 hours) at 04/26/2019 1945 Last data filed at 04/26/2019 1900 Gross per 24 hour  Intake 3759.32 ml  Output 5142 ml  Net -1382.68 ml   Filed Weights   04/24/19 0423 04/25/19 0500 04/26/19 0500  Weight: 102 kg 103.6 kg 101.4 kg   Physical Exam:  General: A/O x4, positive acute respiratory distress Eyes: negative scleral hemorrhage, negative anisocoria, negative icterus ENT: Negative Runny nose, negative gingival bleeding, Neck:  Negative scars, masses, torticollis, lymphadenopathy, JVD Lungs: Tachypneic clear to auscultation bilaterally without wheezes or crackles Cardiovascular: Tachycardic  without murmur gallop or rub normal S1 and S2 Abdomen: negative abdominal pain, positive distention, but not as firm as 11/26, positive hypoactive  bowel sounds, no rebound, no ascites, no appreciable mass Extremities: No significant cyanosis, clubbing, or edema bilateral lower extremities Skin: Negative rashes, lesions, ulcers Psychiatric: Intubated and sedated Central nervous system: Intubated and sedated Genitourinary; SEVERE INCREASED swelling/erythema bilateral scrotum LEFT>> RIGHT.  Tracking of swelling and erythema into LEFT inguinal canal most likely in the abdomen.       Data Reviewed: Care during the described time interval was provided by me .  I have reviewed this patient's available data, including medical history, events of note, physical examination, and all test results as part of my evaluation.   CBC: Recent Labs  Lab 04/20/19 0330  04/02/2019 0455  04/24/19 0456 04/25/19 0545 04/25/19 0958 04/25/19 1220 04/26/19 0258 04/26/19 0445  WBC 19.3*   < > 21.2*  --  16.0* 12.2* 13.2*   --   --  13.2*  NEUTROABS 16.8*  --   --   --  13.5*  --  11.1*  --   --  10.9*  HGB 12.3*   < > 9.7*   < > 8.9* 9.2* 9.5* 10.9* 10.5* 9.0*  HCT 44.5   < > 33.9*   < > 32.2* 32.2* 33.5* 32.0* 31.0* 31.7*  MCV 73.7*   < > 72.9*  --  74.9* 71.7* 71.6*  --   --  71.7*  PLT 631*   < > 570*  --  280 257 257  --   --  218   < > = values in this interval not displayed.   Basic Metabolic Panel: Recent Labs  Lab 04/23/19 0414  04/24/19 0456 04/25/19 0545 04/25/19 0958 04/25/19 1220 04/25/19 1547 04/26/19 0258 04/26/19 0445 04/26/19 1615  NA  --    < > 152* 143 143 143 143 139 141   139 139  K  --    < >  4.9 4.2 4.3 4.0 4.4 4.2 4.3   4.3 4.5  CL  --    < > 117* 109 108  --  107  --  104   103 104  CO2  --    < > 23 24 24   --  25  --  26   25 26   GLUCOSE  --    < > 167* 180* 139*  --  166*  --  124*   119* 134*  BUN  --    < > 143* 96* 85*  --  78*  --  61*   62* 64*  CREATININE  --    < > 4.27* 2.80* 2.41*  --  2.37*  --  1.99*   1.97* 1.98*  CALCIUM  --    < > 6.9* 7.3* 6.6*  --  7.3*  --  7.9*   7.8* 7.7*  MG 3.2*  --  2.7* 2.6* 2.3  --   --   --  2.6*  --   PHOS  --    < >  --  4.0 3.8  --  4.9*  --  4.7*   4.7* 4.3   < > = values in this interval not displayed.   GFR: Estimated Creatinine Clearance: 42.4 mL/min (A) (by C-G formula based on SCr of 1.98 mg/dL (H)). Liver Function Tests: Recent Labs  Lab 04/20/19 0330  04/25/19 0545 04/25/19 0958 04/25/19 1547 04/26/19 0445 04/26/19 1615  AST 44*  --   --  38  --  40  --   ALT 34  --   --  22  --  24  --   ALKPHOS 107  --   --  128*  --  122  --   BILITOT 0.8  --   --  0.4  --  0.4  --   PROT 7.5  --   --  5.9*  --  6.1*  --   ALBUMIN 2.6*   < > 1.5* 1.5* 1.6* 1.6*   1.6* 1.8*   < > = values in this interval not displayed.   No results for input(s): LIPASE, AMYLASE in the last 168 hours. No results for input(s): AMMONIA in the last 168 hours. Coagulation Profile: No results for input(s): INR, PROTIME in the last 168  hours. Cardiac Enzymes: No results for input(s): CKTOTAL, CKMB, CKMBINDEX, TROPONINI in the last 168 hours. BNP (last 3 results) No results for input(s): PROBNP in the last 8760 hours. HbA1C: No results for input(s): HGBA1C in the last 72 hours. CBG: Recent Labs  Lab 04/26/19 0337 04/26/19 0445 04/26/19 0817 04/26/19 1236 04/26/19 1620  GLUCAP 127* 127* 130* 117* 175*   Lipid Profile: No results for input(s): CHOL, HDL, LDLCALC, TRIG, CHOLHDL, LDLDIRECT in the last 72 hours. Thyroid Function Tests: No results for input(s): TSH, T4TOTAL, FREET4, T3FREE, THYROIDAB in the last 72 hours. Anemia Panel: No results for input(s): VITAMINB12, FOLATE, FERRITIN, TIBC, IRON, RETICCTPCT in the last 72 hours. Urine analysis:    Component Value Date/Time   COLORURINE YELLOW 04/24/2019 1758   APPEARANCEUR CLOUDY (A) 04/24/2019 1758   LABSPEC 1.013 04/24/2019 1758   PHURINE 5.0 04/24/2019 1758   GLUCOSEU NEGATIVE 04/24/2019 1758   HGBUR MODERATE (A) 04/24/2019 1758   BILIRUBINUR NEGATIVE 04/24/2019 1758   KETONESUR NEGATIVE 04/24/2019 1758   PROTEINUR 30 (A) 04/24/2019 1758   NITRITE NEGATIVE 04/24/2019 1758   LEUKOCYTESUR NEGATIVE 04/24/2019 1758   Sepsis Labs: @LABRCNTIP (procalcitonin:4,lacticidven:4)  )  Recent Results (from the past 240 hour(s))  Culture, blood (routine x 2)     Status: None (Preliminary result)   Collection Time: 04/20/19  9:28 AM   Specimen: BLOOD  Result Value Ref Range Status   Specimen Description   Final    BLOOD RIGHT HAND Performed at [facility], [address] [place], [place] [address]    Special Requests   Final    BOTTLES DRAWN AEROBIC ONLY Blood Culture adequate volume Performed at [facility], [address], [place], [place] [address]    Culture   Final    NO GROWTH 2 DAYS Performed at [facility], [address], [place], [place] [address]    Report Status PENDING  Incomplete  Culture,  blood (routine x 2)     Status: None (Preliminary result)   Collection Time: 04/20/19  9:32 AM   Specimen: BLOOD  Result Value Ref Range Status   Specimen Description   Final    BLOOD RIGHT ANTECUBITAL Performed at [facility], [address], [place], [place] [address]    Special Requests   Final    BOTTLES DRAWN AEROBIC ONLY Blood Culture adequate volume Performed at [facility], [address] [place], [place] [address]    Culture   Final    NO GROWTH 2 DAYS Performed at [facility], [address] [place], [place] [address]    Report Status PENDING  Incomplete  Culture, respiratory (non-expectorated)     Status: None   Collection Time: 04/20/19  9:47 AM   Specimen: Tracheal Aspirate; Respiratory  Result Value Ref Range Status   Specimen Description   Final    TRACHEAL ASPIRATE Performed at [facility], [address] [place], [place] [address]    Special Requests   Final    Normal Performed at [facility], [address], [place], [place] [address]    Gram Stain   Final    ABUNDANT WBC PRESENT, PREDOMINANTLY PMN NO ORGANISMS SEEN Performed at [facility], [address] [place], [place] [address]    Culture RARE KLEBSIELLA PNEUMONIAE  Final   Report Status 04/08/2019 FINAL  Final   Organism ID, Bacteria KLEBSIELLA PNEUMONIAE  Final      Susceptibility   Klebsiella pneumoniae - MIC*    AMPICILLIN >=32 RESISTANT Resistant     CEFAZOLIN <=4 SENSITIVE Sensitive     CEFEPIME <=1 SENSITIVE Sensitive     CEFTAZIDIME <=1 SENSITIVE Sensitive     CEFTRIAXONE <=1 SENSITIVE Sensitive     CIPROFLOXACIN <=0.25 SENSITIVE Sensitive     GENTAMICIN <=1 SENSITIVE Sensitive     IMIPENEM <=0.25 SENSITIVE Sensitive     TRIMETH/SULFA <=20 SENSITIVE Sensitive     AMPICILLIN/SULBACTAM 8 SENSITIVE Sensitive     PIP/TAZO <=4 SENSITIVE Sensitive     Extended ESBL NEGATIVE Sensitive     * RARE  KLEBSIELLA PNEUMONIAE  Culture, blood (routine x 2)     Status: None (Preliminary result)   Collection Time: 04/23/19  6:14 PM   Specimen: BLOOD  Result Value Ref Range Status   Specimen Description   Final    BLOOD LEFT ANTECUBITAL Performed at [facility], [address], [place], [place] [address]    Special Requests   Final    BOTTLES DRAWN AEROBIC ONLY Blood Culture results may not be optimal due to an inadequate volume of blood received  in culture bottles Performed at [facility], [address], [place], [place] [address]    Culture   Final    NO GROWTH 3 DAYS Performed at [facility], [address] [place], [place] [address]    Report Status PENDING  Incomplete  Culture, blood (routine x 2)     Status: None (Preliminary result)   Collection Time: 04/23/19  6:22 PM   Specimen: BLOOD LEFT HAND  Result Value Ref Range Status   Specimen Description   Final    BLOOD LEFT HAND Performed at [facility], [address] [place], [place] [address]    Special Requests   Final    BOTTLES DRAWN AEROBIC ONLY Blood Culture adequate volume Performed at [facility], [address] [place], [place] [address]    Culture   Final    NO GROWTH 3 DAYS Performed at [facility], [address], [place], [place] [address]    Report Status PENDING  Incomplete         Radiology Studies: Ct Abdomen Pelvis Wo Contrast  Result Date: 04/26/2019 CLINICAL DATA:  Scrotal mass, Fournier's gangrene suspected by ultrasound EXAM: CT ABDOMEN AND PELVIS WITHOUT CONTRAST TECHNIQUE: Multidetector CT imaging of the abdomen and pelvis was performed following the standard protocol without IV contrast. COMPARISON:  Scrotal ultrasound, 04/23/2019 FINDINGS: Lower chest: There are trace bilateral pleural effusions and associated atelectasis or consolidation. Hepatobiliary: No solid liver abnormality is seen. No  gallstones, gallbladder wall thickening, or biliary dilatation. Pancreas: Unremarkable. No pancreatic ductal dilatation or surrounding inflammatory changes. Spleen: Normal in size without significant abnormality. Adrenals/Urinary Tract: Adrenal glands are unremarkable. Kidneys are normal, without renal calculi, solid lesion, or hydronephrosis. Foley catheter within the urinary bladder. Stomach/Bowel: Stomach is within normal limits. Esophagogastric tube is positioned with tip and side port below the diaphragm, tip within the descending duodenum. Appendix appears normal. No evidence of bowel wall thickening, distention, or inflammatory changes. Pancolonic diverticulosis. Rectal tube within the rectum. Vascular/Lymphatic: No significant vascular findings are present. No enlarged abdominal or pelvic lymph nodes. Reproductive: There is extensive scrotal edema and florid gas formation within the left scrotum, tracking up through the inguinal canal, into the subcutaneous soft tissues of the inguinal fold, and into the abdominal cavity, a small air and fluid collection about the sigmoid colon and left testicular vein measuring 5.5 x 4.4 x 2.5 cm (series 4, image 71, series 603, image 150). There is extensive extraluminal gas within the small bowel mesentery and mesocolon (series 4, image 71). Other: Anasarca.  No abdominopelvic ascites. Musculoskeletal: No acute or significant osseous findings. IMPRESSION: 1. Extensive scrotal edema and florid gas formation within the left scrotum, tracking up through the inguinal canal, into the subcutaneous soft tissues of the inguinal fold, and into the abdominal cavity, a 5.5 x 4.4 x 2.5 cm air and fluid collection about the sigmoid colon and left testicular vein measuring 5.5 x 4.4 cm. Findings remain consistent with Fournier's gangrene as previously seen by scrotal ultrasound, now with intra-abdominal extent demonstrated by CT. 2. Trace bilateral pleural effusions and associated  atelectasis or consolidation of the included lung bases. 3. Esophagogastric tube is positioned with tip and side port below the diaphragm, tip within the descending duodenum. Consider retraction for intragastric tip position. These results will be called to the ordering clinician or representative by the Radiologist Assistant, and communication documented in the PACS or zVision Dashboard. Electronically Signed   By:  Lauralyn Primes M.D.   On: 04/26/2019 11:52   Dg Abd 1 View  Result Date: 04/25/2019 CLINICAL DATA:  Ileus. EXAM: ABDOMEN - 1 VIEW COMPARISON:  None. FINDINGS: Nasogastric tube extends through the stomach and into the region of the duodenal bulb/proximal descending duodenum. Bowel gas pattern demonstrates no significant dilated bowel loops or obstructive pattern. No gross signs of free air. No abnormal calcifications. IMPRESSION: Nasogastric tube tip lies in the region of the duodenal bulb/proximal descending duodenum. Normal bowel gas pattern. Electronically Signed   By: Irish Lack M.D.   On: 04/25/2019 11:25   US Renal  Result Date: 04/26/2019 CLINICAL DATA:  Acute kidney injury. COVID-19. Fournier's gangrene. EXAM: RENAL / URINARY TRACT ULTRASOUND COMPLETE COMPARISON:  CT scan of the abdomen dated 04/26/2019 FINDINGS: Right Kidney: Renal measurements: 13.4 x 6.2 x 6.9 cm = volume: 276 mL . Echogenicity within normal limits. No mass or hydronephrosis visualized. Left Kidney: Renal measurements: 12.5 x 6.7 x 7.3 cm = volume: 317 mL. Echogenicity within normal limits. No mass or hydronephrosis visualized. Bladder: Bladder is empty with a Foley catheter in place. Other: None. IMPRESSION: Normal appearing kidneys. Electronically Signed   By: Francene Boyers M.D.   On: 04/26/2019 13:13   US Scrotum Doppler  Result Date: 04/26/2019 CLINICAL DATA:  Scrotal edema EXAM: SCROTAL ULTRASOUND DOPPLER ULTRASOUND OF THE TESTICLES TECHNIQUE: Complete ultrasound examination of the testicles, epididymis,  and other scrotal structures was performed. Color and spectral Doppler ultrasound were also utilized to evaluate blood flow to the testicles. COMPARISON:  Same-day CT abdomen pelvis, scrotal ultrasound, 04/23/2019 FINDINGS: Right testicle Measurements: 3.8 x 2.1 x 2.8 cm. No mass or microlithiasis visualized. Left testicle Measurements: 3.4 x 2.0 x 2.7 cm. No mass or microlithiasis visualized. Right epididymis:  Normal in size and appearance. Left epididymis:  Mildly enlarged. Hydrocele:  None visualized. Varicocele:  None visualized. Other: Air and fluid collection about the lateral aspect of the left testicle, predominantly obscured by shadowing air foci. Shadowing air foci similarly within left groin. Extensive thickening of the scrotal wall. Pulsed Doppler interrogation of both testes demonstrates hyperemic, low resistance arterial and venous waveforms bilaterally. IMPRESSION: 1. Air and fluid collection about the lateral aspect of the left testicle, predominantly obscured by shadowing air foci. Shadowing air foci similarly within the left groin. 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 2. Mild, nonspecific enlargement of the left epididymis. The testicles proper are normal in appearance with symmetric bilateral hyperemic Doppler flow. 3.  Extensive thickening of the scrotal wall. Electronically Signed   By: Lauralyn Primes M.D.   On: 04/26/2019 13:32   Vas [place] Lower Extremity Venous (dvt)  Result Date: 04/26/2019  Lower Venous Study Indications: Edema.  Risk Factors: COVID 19 positive. Limitations: Poor ultrasound/tissue interface, body habitus and patient immobility. Comparison Study: No prior studies. Performing Technologist: Chanda Busing RVT  Examination Guidelines: A complete evaluation includes B-mode imaging, spectral  Doppler, color Doppler, and power Doppler as needed of all accessible portions of each vessel. Bilateral testing is considered an integral part of a complete examination. Limited examinations for reoccurring indications may be performed as noted.  +---------+---------------+---------+-----------+----------+--------------+  RIGHT     Compressibility Phasicity Spontaneity Properties Thrombus Aging  +---------+---------------+---------+-----------+----------+--------------+  CFV       Full            Yes  Yes                                    +---------+---------------+---------+-----------+----------+--------------+  SFJ       Full                                                             +---------+---------------+---------+-----------+----------+--------------+  FV Prox   Full                                                             +---------+---------------+---------+-----------+----------+--------------+  FV Mid    Full                                                             +---------+---------------+---------+-----------+----------+--------------+  FV Distal Full                                                             +---------+---------------+---------+-----------+----------+--------------+  PFV       Full                                                             +---------+---------------+---------+-----------+----------+--------------+  POP       Full            Yes       Yes                                    +---------+---------------+---------+-----------+----------+--------------+  PTV       Full                                                             +---------+---------------+---------+-----------+----------+--------------+  PERO      Full                                                             +---------+---------------+---------+-----------+----------+--------------+   +---------+---------------+---------+-----------+----------+--------------+  LEFT       Compressibility Phasicity Spontaneity Properties Thrombus Aging  +---------+---------------+---------+-----------+----------+--------------+  CFV       Full            Yes       Yes                                    +---------+---------------+---------+-----------+----------+--------------+  SFJ       Full                                                             +---------+---------------+---------+-----------+----------+--------------+  FV Prox   Full                                                             +---------+---------------+---------+-----------+----------+--------------+  FV Mid    Full                                                             +---------+---------------+---------+-----------+----------+--------------+  FV Distal Full                                                             +---------+---------------+---------+-----------+----------+--------------+  PFV       Full                                                             +---------+---------------+---------+-----------+----------+--------------+  POP       Full            Yes       Yes                                    +---------+---------------+---------+-----------+----------+--------------+  PTV       Full                                                             +---------+---------------+---------+-----------+----------+--------------+  PERO      Full                                                             +---------+---------------+---------+-----------+----------+--------------+  Summary: Right: There is no evidence of deep vein thrombosis in the lower extremity. No cystic structure found in the popliteal fossa. Left: There is no evidence of deep vein thrombosis in the lower extremity. No cystic structure found in the popliteal fossa.  *See table(s) above for measurements and observations.    Preliminary         Scheduled Meds:  amiodarone  200 mg Per Tube BID   artificial tears  1 application  Both Eyes Q8H   chlorhexidine  15 mL Mouth/Throat BID   Chlorhexidine Gluconate Cloth  6 each Topical Daily   folic acid  1 mg Per Tube Daily   free water  300 mL Per Tube Q8H   insulin aspart  0-15 Units Subcutaneous Q4H   ipratropium-albuterol  3 mL Nebulization Q4H   mouth rinse  15 mL Mouth Rinse 10 times per day   multivitamin  15 mL Per Tube Daily   pantoprazole sodium  40 mg Per Tube Daily   sodium chloride flush  10-40 mL Intracatheter Q12H   thiamine  100 mg Per Tube Daily   Continuous Infusions:   prismasol BGK 4/2.5 400 mL/hr at 04/26/19 1204    prismasol BGK 4/2.5 200 mL/hr at 04/26/19 1205   sodium chloride 10 mL/hr at 04/26/19 1900   clindamycin (CLEOCIN) IV Stopped (04/26/19 1353)   feeding supplement (VITAL AF 1.2 CAL) 75 mL/hr at 04/26/19 1300   fentaNYL infusion INTRAVENOUS 300 mcg/hr (04/26/19 1900)   heparin 10,000 units/ 20 mL infusion syringe 750 Units/hr (04/26/19 1800)   heparin 400 Units/hr (04/26/19 1900)   meropenem (MERREM) IV Stopped (04/26/19 1849)   midazolam 3 mg/hr (04/26/19 1900)   phenylephrine (NEO-SYNEPHRINE) Adult infusion Stopped (04/25/19 2000)   prismasol BGK 4/2.5 2,000 mL/hr at 04/26/19 1452   vancomycin 1,000 mg (04/25/19 2316)     LOS: 4 days   The patient is critically ill with multiple organ systems failure and requires high complexity decision making for assessment and support, frequent evaluation and titration of therapies, application of advanced monitoring technologies and extensive interpretation of multiple databases. Critical Care Time devoted to patient care services described in this note  Time spent: 40 minutes     Tamie Minteer, Roselind Messier, MD [facility] Pager [phone]  If 7PM-7AM, please contact night-coverage [email or web address] Password [facility] 04/26/2019, 7:45 PM

## 2019-04-26 NOTE — Progress Notes (Signed)
Attempted to call pts sister, Verdis Frederickson, to provide daily updates at this time. Will try again in the morning.

## 2019-04-26 NOTE — Progress Notes (Signed)
Attempted to call nephew at this time, will try again in the morning.

## 2019-04-26 NOTE — Progress Notes (Signed)
Patient transported to CT and back to ICU on current vent settings, and FiO2 of 1.00.  Patient tolerated well.

## 2019-04-26 NOTE — Progress Notes (Addendum)
Notified by primary RN that upon her review of progress note by case manager date 04/21/2019 the patient's correct point of contact are brother-in-law Philomena Course and sister Gilberto Better and that Vazguez is not the correct point of contact or decision maker.  Immediately notified Drs. Clydene Laming and [employer].  Called and updated Shanon Brow and Verdis Frederickson of current diagnoses, reviewed extensively.  They are aware and are to discuss among themselves goals of care moving forward to either pursue surgery versus transitioning to comfort care focus.  Doctors Gloriann Loan and Doctor, hospital also updated.  In the meantime will continue current treatment plan and rescind DNR status.  Francine Graven, MSN, AGACNP  [facility]

## 2019-04-26 NOTE — Progress Notes (Signed)
Bilateral lower extremity venous duplex has been completed. Preliminary results can be found in CV Proc through chart review.   04/26/19 5:15 PM Spencer Richard RVT

## 2019-04-26 NOTE — Progress Notes (Signed)
[facility] Progress Note  Subjective: no new issues per chart review.  Patient not examined today directly given COVID-19 + status, utilizing exam of the primary team and observations of RN's.    Vitals:   04/26/19 1430 04/26/19 1445 04/26/19 1500 04/26/19 1554  BP: 103/73 98/67 98/66  92/65  Pulse:    63  Resp: (!) 26 (!) 26 (!) 26 (!) 28  Temp:      TempSrc:      SpO2:   100% 100%  Weight:      Height:        Inpatient medications: . amiodarone  200 mg Per Tube BID  . artificial tears  1 application Both Eyes Q8H  . chlorhexidine  15 mL Mouth/Throat BID  . Chlorhexidine Gluconate Cloth  6 each Topical Daily  . folic acid  1 mg Per Tube Daily  . free water  300 mL Per Tube Q8H  . insulin aspart  0-15 Units Subcutaneous Q4H  . ipratropium-albuterol  3 mL Nebulization Q4H  . mouth rinse  15 mL Mouth Rinse 10 times per day  . multivitamin  15 mL Per Tube Daily  . pantoprazole sodium  40 mg Per Tube Daily  . sodium chloride flush  10-40 mL Intracatheter Q12H  . thiamine  100 mg Per Tube Daily   .  prismasol BGK 4/2.5 400 mL/hr at 04/26/19 1204  .  prismasol BGK 4/2.5 200 mL/hr at 04/26/19 1205  . sodium chloride 10 mL/hr at 04/26/19 1600  . clindamycin (CLEOCIN) IV Stopped (04/26/19 1353)  . feeding supplement (VITAL AF 1.2 CAL) 75 mL/hr at 04/26/19 1300  . fentaNYL infusion INTRAVENOUS 300 mcg/hr (04/26/19 1600)  . heparin 10,000 units/ 20 mL infusion syringe 750 Units/hr (04/26/19 1600)  . heparin 400 Units/hr (04/26/19 1600)  . meropenem (MERREM) IV 1 g (04/26/19 0541)  . midazolam 3 mg/hr (04/26/19 1600)  . phenylephrine (NEO-SYNEPHRINE) Adult infusion Stopped (04/25/19 2000)  . prismasol BGK 4/2.5 2,000 mL/hr at 04/26/19 1452  . vancomycin 1,000 mg (04/25/19 2316)   sodium chloride, acetaminophen, alteplase, fentaNYL, heparin, metoprolol tartrate, midazolam, sodium chloride, sodium chloride flush, vecuronium    Exam:  Patient not examined today  directly given COVID-19 + status, utilizing exam of the primary team and observations of RN's.    Home meds:  - from H&P the following home meds were listed >   lurasidone 80 mg qd/ chlordiazepoxide 10mg  qd prn withdrawal/ mirtazapine 15mg  hs/ paroxetine 20 mg qd    CXR 11/25 - bilat mulitfocal infiltrates    I/O last 3 days > 10L +/ 7.5 L neg, net + 2.8L   UNa 67,  UCr 37       UA 11/25 > 11-20 wbc/ 21-50 rbc, 30 protein, 1.013   Assessment/ Plan: 1. AKI - due to ATN most likely from hypoperfusion/ shock that required pressors after admission. Sig vol overload on exam, not sure central pressures. Renal 12/25 pending. Started CRRT on 11/25:  - I/O net negative 1.7L yest, wt's down 2kg  - FiO2 still 40%   - getting SQ hep 7500 tid and CRRT hep flat rate 750u /hr , PTT ok at 37  - cont UF 50- 100 cc/hr net neg as tolerated 2. COVID+ PNA/ resp failure 3. Fournier's gangrene - seen by urology, worsening 4. H/o depression 5. Anemia - Hb 8.9 6. Vol overload - as above 7. Afib w/ RVR     Rob Kenzie Thoreson 04/26/2019, 4:53 PM  Iron/TIBC/Ferritin/ %  Sat    Component Value Date/Time   FERRITIN 79 04/20/2019 0330   Recent Labs  Lab 04/26/19 0445  NA 141  139  K 4.3  4.3  CL 104  103  CO2 26  25  GLUCOSE 124*  119*  BUN 61*  62*  CREATININE 1.99*  1.97*  CALCIUM 7.9*  7.8*  PHOS 4.7*  4.7*  ALBUMIN 1.6*  1.6*   Recent Labs  Lab 04/26/19 0445  AST 40  ALT 24  ALKPHOS 122  BILITOT 0.4  PROT 6.1*   Recent Labs  Lab 04/26/19 0445  WBC 13.2*  HGB 9.0*  HCT 31.7*  PLT 218

## 2019-04-26 NOTE — Progress Notes (Signed)
ANTICOAGULATION CONSULT NOTE - Initial Consult  Pharmacy Consult for heparin Indication: VTE treatment  No Known Allergies  Patient Measurements: Height: 5\' 3"  (160 cm) Weight: 223 lb 8.7 oz (101.4 kg) IBW/kg (Calculated) : 56.9 Heparin Dosing Weight: 79 kg   Vital Signs: Temp: 97.5 F (36.4 C) (11/27 0800) Temp Source: Oral (11/27 0800) BP: 99/66 (11/27 0845) Pulse Rate: 73 (11/27 0845)  Labs: Recent Labs    04/25/19 0545 04/25/19 0958 04/25/19 1220 04/25/19 1547 04/26/19 0258 04/26/19 0445  HGB 9.2* 9.5* 10.9*  --  10.5* 9.0*  HCT 32.2* 33.5* 32.0*  --  31.0* 31.7*  PLT 257 257  --   --   --  218  APTT 46*  --   --   --   --  37*  HEPARINUNFRC 0.14*  --   --   --   --   --   CREATININE 2.80* 2.41*  --  2.37*  --  1.99*  1.97*    Estimated Creatinine Clearance: 42.2 mL/min (A) (by C-G formula based on SCr of 1.99 mg/dL (H)).   Medical History: Past Medical History:  Diagnosis Date  . Anxiety   . Depression   . Diabetes mellitus, type 2 ([place]) 03/2019   A1C 6.7% 03/2019  . HTN (hypertension)   . Obesity     Medications:  No medications prior to admission.    Assessment: 3 YOM currently intubated secondary to COVID-19 pneumonia now with cool extremities concerning for DVT's. Pharmacy consulted to start IV heparin. Patient is currently on SQ heparin 7500 units Q 8 hours and also receiving Heparin 750 units/hr via the CRRT circuit. Of note, last dose of SQ heparin was this morning at 0541. APTT this AM was 37s   H/H low stable, Plt wnl, D-dimer 7.6>3.3  Goal of Therapy:  Heparin level 0.3-0.7 units/ml Monitor platelets by anticoagulation protocol: Yes   Plan:  -Start IV heparin at 400 units/hr. This in addition to IV heparin via CRRT circuit should equal 1150 units/hr  -F/u 8 hr HL -MOnitor daily HL, CBC and s/s of bleeding   Albertina Parr, PharmD., BCPS Clinical Pharmacist Clinical phone for 04/26/19 until 5pm: [phone]

## 2019-04-26 NOTE — Progress Notes (Signed)
Update: I was notified of the point of contact change which is his sister Spencer Richard.  I called the number listed which is Spencer Richard's phone.  Spencer Richard was also present.  I again went through everything as detailed in my previous note.  I again discussed the options of surgery versus observation/comfort care/medical management.  They would like to proceed with medical management alone and no surgery at this time which I think is certainly reasonable given his poor prognosis.

## 2019-04-27 DIAGNOSIS — J1289 Other viral pneumonia: Secondary | ICD-10-CM

## 2019-04-27 LAB — D-DIMER, QUANTITATIVE: D-Dimer, Quant: 2.9 ug/mL-FEU — ABNORMAL HIGH (ref 0.00–0.50)

## 2019-04-27 LAB — CBC WITH DIFFERENTIAL/PLATELET
Abs Immature Granulocytes: 1.57 10*3/uL — ABNORMAL HIGH (ref 0.00–0.07)
Basophils Absolute: 0 10*3/uL (ref 0.0–0.1)
Basophils Relative: 0 %
Eosinophils Absolute: 0 10*3/uL (ref 0.0–0.5)
Eosinophils Relative: 0 %
HCT: 30.9 % — ABNORMAL LOW (ref 39.0–52.0)
Hemoglobin: 8.8 g/dL — ABNORMAL LOW (ref 13.0–17.0)
Immature Granulocytes: 7 %
Lymphocytes Relative: 7 %
Lymphs Abs: 1.6 10*3/uL (ref 0.7–4.0)
MCH: 20.2 pg — ABNORMAL LOW (ref 26.0–34.0)
MCHC: 28.5 g/dL — ABNORMAL LOW (ref 30.0–36.0)
MCV: 71 fL — ABNORMAL LOW (ref 80.0–100.0)
Monocytes Absolute: 1.6 10*3/uL — ABNORMAL HIGH (ref 0.1–1.0)
Monocytes Relative: 7 %
Neutro Abs: 19.1 10*3/uL — ABNORMAL HIGH (ref 1.7–7.7)
Neutrophils Relative %: 79 %
Platelets: 329 10*3/uL (ref 150–400)
RBC: 4.35 MIL/uL (ref 4.22–5.81)
RDW: 20.2 % — ABNORMAL HIGH (ref 11.5–15.5)
WBC: 24 10*3/uL — ABNORMAL HIGH (ref 4.0–10.5)
nRBC: 1 % — ABNORMAL HIGH (ref 0.0–0.2)

## 2019-04-27 LAB — HEPATIC FUNCTION PANEL
ALT: 52 U/L — ABNORMAL HIGH (ref 0–44)
AST: 142 U/L — ABNORMAL HIGH (ref 15–41)
Albumin: 1.7 g/dL — ABNORMAL LOW (ref 3.5–5.0)
Alkaline Phosphatase: 153 U/L — ABNORMAL HIGH (ref 38–126)
Bilirubin, Direct: 0.1 mg/dL (ref 0.0–0.2)
Indirect Bilirubin: 0.3 mg/dL (ref 0.3–0.9)
Total Bilirubin: 0.4 mg/dL (ref 0.3–1.2)
Total Protein: 6.2 g/dL — ABNORMAL LOW (ref 6.5–8.1)

## 2019-04-27 LAB — RENAL FUNCTION PANEL
Albumin: 1.8 g/dL — ABNORMAL LOW (ref 3.5–5.0)
Anion gap: 13 (ref 5–15)
BUN: 53 mg/dL — ABNORMAL HIGH (ref 6–20)
CO2: 25 mmol/L (ref 22–32)
Calcium: 8 mg/dL — ABNORMAL LOW (ref 8.9–10.3)
Chloride: 103 mmol/L (ref 98–111)
Creatinine, Ser: 1.74 mg/dL — ABNORMAL HIGH (ref 0.61–1.24)
GFR calc Af Amer: 49 mL/min — ABNORMAL LOW (ref >60)
GFR calc non Af Amer: 42 mL/min — ABNORMAL LOW (ref >60)
Glucose, Bld: 64 mg/dL — ABNORMAL LOW (ref 70–99)
Phosphorus: 4.5 mg/dL (ref 2.5–4.6)
Potassium: 4.7 mmol/L (ref 3.5–5.1)
Sodium: 141 mmol/L (ref 135–145)

## 2019-04-27 LAB — GLUCOSE, CAPILLARY
Glucose-Capillary: 101 mg/dL — ABNORMAL HIGH (ref 70–99)
Glucose-Capillary: 108 mg/dL — ABNORMAL HIGH (ref 70–99)
Glucose-Capillary: 137 mg/dL — ABNORMAL HIGH (ref 70–99)
Glucose-Capillary: 98 mg/dL (ref 70–99)

## 2019-04-27 LAB — APTT: aPTT: 39 seconds — ABNORMAL HIGH (ref 24–36)

## 2019-04-27 LAB — MAGNESIUM: Magnesium: 2.8 mg/dL — ABNORMAL HIGH (ref 1.7–2.4)

## 2019-04-27 LAB — PROCALCITONIN: Procalcitonin: 4.76 ng/mL

## 2019-04-27 LAB — C-REACTIVE PROTEIN: CRP: 15.1 mg/dL — ABNORMAL HIGH (ref <1.0)

## 2019-04-27 MED ORDER — LORAZEPAM 2 MG/ML IJ SOLN: 1.0000 mg | INTRAMUSCULAR | Status: DC | PRN

## 2019-04-27 MED ORDER — MIDAZOLAM HCL 2 MG/2ML IJ SOLN: 1.0000 mg | INTRAMUSCULAR | Status: DC | PRN

## 2019-04-27 MED ORDER — FENTANYL CITRATE (PF) 100 MCG/2ML IJ SOLN
25.0000 ug | INTRAMUSCULAR | Status: DC | PRN
  Administered 2019-04-27 (×7): 25 ug via INTRAVENOUS

## 2019-04-27 MED ORDER — GLYCOPYRROLATE 1 MG PO TABS
1.0000 mg | ORAL_TABLET | ORAL | Status: DC | PRN
  Filled 2019-04-27: qty 1

## 2019-04-27 MED ORDER — DIPHENHYDRAMINE HCL 50 MG/ML IJ SOLN: 25.0000 mg | INTRAMUSCULAR | Status: DC | PRN

## 2019-04-27 MED ORDER — HALOPERIDOL LACTATE 5 MG/ML IJ SOLN: 2.5000 mg | INTRAMUSCULAR | Status: DC | PRN

## 2019-04-27 MED ORDER — ACETAMINOPHEN 650 MG RE SUPP
650.0000 mg | RECTAL | Status: DC | PRN
  Filled 2019-04-27 (×3): qty 1

## 2019-04-27 MED ORDER — SODIUM CHLORIDE 0.9 % IV SOLN
0.5000 mg/h | INTRAVENOUS | Status: DC
  Administered 2019-04-27: 3 mg/h via INTRAVENOUS
  Administered 2019-04-27: 1 mg/h via INTRAVENOUS
  Administered 2019-04-27: 2.5 mg/h via INTRAVENOUS
  Administered 2019-04-28: 4 mg/h via INTRAVENOUS
  Filled 2019-04-27 (×4): qty 5

## 2019-04-27 MED ORDER — ACETAMINOPHEN 10 MG/ML IV SOLN
1000.0000 mg | Freq: Once | INTRAVENOUS | Status: AC
  Administered 2019-04-27: 1000 mg via INTRAVENOUS
  Filled 2019-04-27: qty 100

## 2019-04-27 MED ORDER — FENTANYL CITRATE (PF) 100 MCG/2ML IJ SOLN: 25.0000 ug | INTRAMUSCULAR | Status: DC | PRN

## 2019-04-27 MED ORDER — LORAZEPAM 2 MG/ML IJ SOLN
2.0000 mg | INTRAMUSCULAR | Status: DC | PRN
  Administered 2019-04-27 – 2019-04-28 (×6): 2 mg via INTRAVENOUS
  Filled 2019-04-27 (×6): qty 1

## 2019-04-27 MED ORDER — GLYCOPYRROLATE 0.2 MG/ML IJ SOLN
0.2000 mg | INTRAMUSCULAR | Status: DC | PRN
  Administered 2019-04-27 – 2019-04-28 (×3): 0.2 mg via INTRAVENOUS
  Filled 2019-04-27 (×4): qty 1

## 2019-04-27 MED ORDER — MIDAZOLAM HCL 2 MG/2ML IJ SOLN: 2.0000 mg | INTRAMUSCULAR | Status: DC | PRN

## 2019-04-27 MED ORDER — HYDROMORPHONE BOLUS VIA INFUSION
0.5000 mg | INTRAVENOUS | Status: DC | PRN
  Administered 2019-04-27 – 2019-04-28 (×10): 0.5 mg via INTRAVENOUS
  Filled 2019-04-27: qty 1

## 2019-04-27 MED ORDER — GLYCOPYRROLATE 0.2 MG/ML IJ SOLN
0.2000 mg | INTRAMUSCULAR | Status: DC | PRN
  Filled 2019-04-27: qty 1

## 2019-04-27 MED ORDER — POLYVINYL ALCOHOL 1.4 % OP SOLN
1.0000 [drp] | Freq: Four times a day (QID) | OPHTHALMIC | Status: DC | PRN
  Filled 2019-04-27: qty 15

## 2019-04-27 NOTE — Progress Notes (Addendum)
NAME:  Spencer Richard, MRN:  [id number], DOB:  February 14, 1961, LOS: 5 ADMISSION DATE:  04/21/2019, CONSULTATION DATE:  11/17 REFERRING MD:  TRH, CHIEF COMPLAINT:  dyspnea   Brief History   Corrected age and identity of 58 y/o alcoholic male with COVID 19 pneumonia, intubated in setting of encephalopathy and respiratory failure on 11/17. (was identified as Spencer Richard [phone] M on admission)  Past Medical History  HTN  Significant Hospital Events   11/15-Admit 11/17- Intubation for respiratory failure, severe bradycardia on precedex requiring atropine 11/21- Persistent fevers, Rapid afib, started amio 11/22- Converted to NSR 11/24-scrotal edema concerning for gas-forming severe epididymorchitis   Consults:  PCCM Urology  Nephrology   Procedures:  ETT 11/17 >  R IJ CVL 11/21>  Trialysis L IJ 11/25>  Significant Diagnostic Tests:  11/24 scrotal US "Soft tissue swelling in particularly the LEFT hemiscrotum associated with numerous bright echogenic foci which demonstrate posterior acoustic shadowing consistent with gas, representing Fournier's Gangrene."  11/27 CT AP Extensive scrotal edema and florid gas formation within the left scrotum, tracking up through the inguinal canal, into the subcutaneous soft tissues of the inguinal fold, and into the abdominal cavity, a 5.5 x 4.4 x 2.5 cm air and fluid collection about the sigmoid colon and left testicular vein measuring 5.5 x 4.4 cm. Findings remain consistent with Fournier's gangrene as previously seen by scrotal ultrasound, now with intra-abdominal extent demonstrated by CT.  11/27 Renal US Normal appearing kidneys.   Micro Data:  COVID 19 positive Blood cx 11/15>>>NTD Tracheal aspirate 11/21>Klebsiella, ampicillin resistant Blood 11/24>>  Antimicrobials:  Remdisivir 11/15>>> 11/19 Rocephin 11/15>>> 11/20 Zithromax 11/16>>> 11/20  Cefepime 11/21 >>11/24 Decadron 11/16 (note that d/t pt being identity clarified 11/24,  decadron order "fell off" from chart merge so 3 more doses ordered 11/25) >>  Meropenem 11/24>> Vancomycin 11/24>>  Interim history/subjective:  No changes overnight. Still on CRRT, vent, off pressors.   Objective   Blood pressure 92/62, pulse 60, temperature (!) 94.3 F (34.6 C), temperature source Oral, resp. rate (!) 26, height 5\' 3"  (1.6 m), weight 97.4 kg, SpO2 99 %.    Vent Mode: PRVC FiO2 (%):  [40 %] 40 % Set Rate:  [26 bmp] 26 bmp Vt Set:  [450 mL] 450 mL PEEP:  [5 cmH20] 5 cmH20 Plateau Pressure:  [18 cmH20-21 cmH20] 19 cmH20   Intake/Output Summary (Last 24 hours) at 04/27/2019 1230 Last data filed at 04/27/2019 1200 Gross per 24 hour  Intake 2850.73 ml  Output 5720 ml  Net -2869.27 ml   Filed Weights   04/25/19 0500 04/26/19 0500 04/27/19 0500  Weight: 103.6 kg 101.4 kg 97.4 kg    Examination:   General: critically ill appearing middle aged man, in NAD HENT: Spencer Richard/AT, eyes anicteric, pupils equal and reactive  CV: RRR, no murmur Lungs: rhonchi b/l LL, no wheezing, no accessory muscle use ABD: obese, soft, NT GU: more indurated, swollen, and discolored scrotum today with extension into the suprapubic area L>R- now also discolored EXT: improving peripheral edema Skin: warm and dry, no rashes Neuro: pupils equal and reactive, minimally responsive to stimulation, RASS -5       Resolved Hospital Problem list     Assessment & Plan:   ARDS 2/2 COVID19 Pneumonia: He has had slow improvement daily, weaned to 40% FiO2, 5 of PEEP.  Continues to have periods of agitation with SBT. -discussed with family (sister and brother-in-law) that they do not wish to prolong suffering and wish to focus  on keeping him comfortable. They would like to withdraw life support devices and will say their goodbyes post-extubation via facetime. They understand that his fornier's is unlikely to be a survivable condition and severely complicates his COVID -fentanyl & versed for comfort  -palliative extubation once pain, agitation, and air hunger are controlled  Paroxysmal rapid atrial fibrillation, periodic RVR.  Proved overnight.  In sinus rhythm this morning. -pain control -can use metoprolol PRN if tachycardia seems to be causing discomfort   Shock, distributive due to sepsis and sedation.  Low-dose pressors this morning, subsequently weaned off. -withdrawing life support and life prolonging meds  AKI- multifactorial including medications, septic shock, hypoperfusion.  Discontinued Latuda due to same. Making good urine. -d/c CRRT  Klebsiella pneumonia D/c antibiotics  EtOH withdrawal: Continues to require Versed infusion for sedation, vent synchrony agitation -fentanyl & versed for sedation  Elevated blood sugar with A1c 6.7-previously undiagnosed diabetes mellitus -d/c monitoring and insulin  Hypernatremia. Resolved        Best practice:  Diet: NPO Pain/Anxiety/Delirium protocol (if indicated): as above VAP protocol (if indicated): yes DVT prophylaxis:  GI prophylaxis:  Glucose control: SSI  Mobility: bed rest Code Status: DNR Family Communication: as noted  Disposition: remain in ICU  Labs  Reviewed in EMR     This patient is critically ill with multiple organ system failure which requires frequent high complexity decision making, assessment, support, evaluation, and titration of therapies. This was completed through the application of advanced monitoring technologies and extensive interpretation of multiple databases. During this encounter critical care time was devoted to patient care services described in this note for 40 minutes.  Julian Hy, DO 04/27/19 12:36 PM [facility]     Transferring to stepdown unit due to bed availability in the ICU.  Due to floor requirements, fentanyl infusion has been changed to Dilaudid.  Versed infusion will be discontinued, but Ativan PRNs are available to treat agitation, anxiety,  air hunger, tachypnea.  Please notify me if he has uncontrolled symptoms.  Julian Hy, DO 04/27/19 4:53 PM [facility]

## 2019-04-27 NOTE — Progress Notes (Signed)
Extubated pt for comfort care at 1410 placed on 4 lpm [place]

## 2019-04-27 NOTE — Progress Notes (Signed)
ANTICOAGULATION CONSULT NOTE   Pharmacy Consult for heparin Indication: VTE treatment  No Known Allergies  Patient Measurements: Height: 5\' 3"  (160 cm) Weight: 214 lb 11.7 oz (97.4 kg) IBW/kg (Calculated) : 56.9 Heparin Dosing Weight: 79 kg   Vital Signs: Temp: 94.3 F (34.6 C) (11/28 0800) Temp Source: Oral (11/28 0800) BP: 89/60 (11/28 0900) Pulse Rate: 58 (11/28 0900)  Labs: Recent Labs    04/25/19 0545 04/25/19 0958  04/26/19 0258 04/26/19 0445 04/26/19 1615 04/26/19 2024 04/27/19 0545 04/27/19 0546  HGB 9.2* 9.5*   < > 10.5* 9.0*  --   --  PENDING  --   HCT 32.2* 33.5*   < > 31.0* 31.7*  --   --  PENDING  --   PLT 257 257  --   --  218  --   --  329  --   APTT 46*  --   --   --  37*  --   --  39*  --   HEPARINUNFRC 0.14*  --   --   --   --   --  <0.10*  --   --   CREATININE 2.80* 2.41*   < >  --  1.99*  1.97* 1.98*  --   --  1.74*   < > = values in this interval not displayed.    Estimated Creatinine Clearance: 47.3 mL/min (A) (by C-G formula based on SCr of 1.74 mg/dL (H)).   Medical History: Past Medical History:  Diagnosis Date  . Anxiety   . Depression   . Diabetes mellitus, type 2 ([place]) 03/2019   A1C 6.7% 03/2019  . HTN (hypertension)   . Obesity     Medications:  No medications prior to admission.    Assessment: 37 YOM currently intubated secondary to COVID-19 pneumonia now with cool extremities concerning for DVT's. Pharmacy consulted to start IV heparin.   APTT this AM is subtherapeutic at 39s. Heparin level was cancelled this AM. Currently on 750 units/hr systemic heparin + 750 units/hr CRRT circuit heparin. No issues with line or bleeding reported per RN.  Goal of Therapy:  Heparin level 0.3-0.7 units/ml aPTT 66-102 seconds Monitor platelets by anticoagulation protocol: Yes   Plan:  -Increase IV heparin to 950 units/hr. This in addition to IV heparin via CRRT circuit should equal 1500 units/hr  -Ideally we transition off CRRT  heparin to only systemic heparin over next 24 hours or so -F/u 8 hr HL -Monitor daily HL, CBC and s/s of bleeding   Albertina Parr, PharmD., BCPS Clinical Pharmacist Clinical phone for 04/27/19 until 5pm: [phone]

## 2019-04-27 NOTE — Progress Notes (Signed)
Wasted 60mL of versed with Zettie Cooley, RN

## 2019-04-27 NOTE — Progress Notes (Signed)
Pt on comfort care. Transferred pt to 3rd floor PCU

## 2019-04-27 NOTE — Progress Notes (Signed)
Spoke with sister Verdis Frederickson and updated her on pt's clinical status.setup planned facetime after exubation.

## 2019-04-28 ENCOUNTER — Other Ambulatory Visit: Payer: Self-pay

## 2019-04-28 LAB — CULTURE, BLOOD (ROUTINE X 2)
Culture: NO GROWTH
Culture: NO GROWTH
Special Requests: ADEQUATE

## 2019-04-28 MED ORDER — HYDROMORPHONE BOLUS VIA INFUSION
1.0000 mg | INTRAVENOUS | Status: DC | PRN
  Filled 2019-04-28: qty 1

## 2019-04-30 NOTE — Death Summary Note (Signed)
[facility] Death Note                                                                                                                                                                                             DEATH SUMMARY   Patient Details  Name: Spencer Richard MRN: [id number] DOB: Aug 20, 1960  Admission/Discharge Information   Admit Date:  24-Apr-2019  Date of Death: Date of Death: 2019-04-30  Time of Death: Time of Death: 1934/09/17  Length of Stay: 6  Referring Physician: Patient, No Pcp Per   Reason(s) for Hospitalization  Acute Hypoxic Resp Failure due to ARDS from Covid 19 Viral pneumonia and superimposed Klebsiella pneumonia: Acute renal failure PAF with RVR Shock, distributive due to sepsis Fournier's gangrene Etoh Withdrawal with Delirium Tremens  Diagnoses  Preliminary cause of death: Acute hypoxemic respiratory failure due to severe acute respiratory syndrome coronavirus 2 (SARS-CoV-2) disease (HCC) Secondary Diagnoses (including complications and co-morbidities):  Active Problems:   Pneumonia   Pneumonia due to COVID-19 virus   Acute respiratory failure with hypoxia (HCC)   Acute respiratory distress syndrome (ARDS) due to COVID-19 virus (HCC)   Klebsiella pneumonia (HCC)   HCAP (healthcare-associated pneumonia)   AF (paroxysmal atrial fibrillation) (HCC)   Acute renal failure (ARF) (HCC)   Hypotension   Severe sepsis with septic shock (HCC)   Alcohol withdrawal [facility])   Prediabetes  Brief Hospital Course (including significant findings, care, treatment, and services provided and events leading to death)  Spencer Richard is a 58 y.o. year old male (was identified as Spencer Richard 58yo M on admission) with PMHx of depression, anxiety, hypertension, obesity-alcohol abuse-presented with a 3 to 5-day history of worsening shortness of breath, myalgias, fever, cough-found to have severe  hypoxemia secondary to COVID-19 pneumonia-requiring HFNC, admitted to the ICU at [facility]. Further hospital course complicated by worsening hypoxemia requiring intubation, Delrium tremens,afid with rvr,AKI requiring CRRT, fournier's gangrene. Given poor overall prognoses, PCCM d/w sister/brother in law, and after extensive discussion-transitioned to full comfort measures.   Significant Events: 11/15-Admit 11/17- Intubation for respiratory failure, severe bradycardia on precedex requiring atropine 11/21- Persistent fevers, Rapid afib, started amio 11/22- Converted to NSR 11/24-scrotal edema concerning for gas-forming severe epididymorchitis 11/25>>CRRT 11/28>>transitioned to comfort care  Pertinent Labs and Studies  Significant Diagnostic Studies Ct Abdomen Pelvis Wo Contrast  Result Date: 04/26/2019 CLINICAL DATA:  Scrotal mass, Fournier's gangrene suspected by ultrasound EXAM: CT ABDOMEN AND PELVIS WITHOUT CONTRAST TECHNIQUE: Multidetector CT imaging of the abdomen and pelvis was performed following the standard protocol without IV contrast. COMPARISON:  Scrotal ultrasound, 04/23/2019 FINDINGS: Lower chest: There are trace bilateral pleural  effusions and associated atelectasis or consolidation. Hepatobiliary: No solid liver abnormality is seen. No gallstones, gallbladder wall thickening, or biliary dilatation. Pancreas: Unremarkable. No pancreatic ductal dilatation or surrounding inflammatory changes. Spleen: Normal in size without significant abnormality. Adrenals/Urinary Tract: Adrenal glands are unremarkable. Kidneys are normal, without renal calculi, solid lesion, or hydronephrosis. Foley catheter within the urinary bladder. Stomach/Bowel: Stomach is within normal limits. Esophagogastric tube is positioned with tip and side port below the diaphragm, tip within the descending duodenum. Appendix appears normal. No evidence of bowel wall thickening, distention, or inflammatory changes. Pancolonic  diverticulosis. Rectal tube within the rectum. Vascular/Lymphatic: No significant vascular findings are present. No enlarged abdominal or pelvic lymph nodes. Reproductive: There is extensive scrotal edema and florid gas formation within the left scrotum, tracking up through the inguinal canal, into the subcutaneous soft tissues of the inguinal fold, and into the abdominal cavity, a small air and fluid collection about the sigmoid colon and left testicular vein measuring 5.5 x 4.4 x 2.5 cm (series 4, image 71, series 603, image 150). There is extensive extraluminal gas within the small bowel mesentery and mesocolon (series 4, image 71). Other: Anasarca.  No abdominopelvic ascites. Musculoskeletal: No acute or significant osseous findings. IMPRESSION: 1. Extensive scrotal edema and florid gas formation within the left scrotum, tracking up through the inguinal canal, into the subcutaneous soft tissues of the inguinal fold, and into the abdominal cavity, a 5.5 x 4.4 x 2.5 cm air and fluid collection about the sigmoid colon and left testicular vein measuring 5.5 x 4.4 cm. Findings remain consistent with Fournier's gangrene as previously seen by scrotal ultrasound, now with intra-abdominal extent demonstrated by CT. 2. Trace bilateral pleural effusions and associated atelectasis or consolidation of the included lung bases. 3. Esophagogastric tube is positioned with tip and side port below the diaphragm, tip within the descending duodenum. Consider retraction for intragastric tip position. These results will be called to the ordering clinician or representative by the Radiologist Assistant, and communication documented in the PACS or zVision Dashboard. Electronically Signed   By: Lauralyn Primes M.D.   On: 04/26/2019 11:52   Dg Abd 1 View  Result Date: 04/25/2019 CLINICAL DATA:  Ileus. EXAM: ABDOMEN - 1 VIEW COMPARISON:  None. FINDINGS: Nasogastric tube extends through the stomach and into the region of the duodenal  bulb/proximal descending duodenum. Bowel gas pattern demonstrates no significant dilated bowel loops or obstructive pattern. No gross signs of free air. No abnormal calcifications. IMPRESSION: Nasogastric tube tip lies in the region of the duodenal bulb/proximal descending duodenum. Normal bowel gas pattern. Electronically Signed   By: Irish Lack M.D.   On: 04/25/2019 11:25   US Scrotum  Result Date: 04/23/2019 CLINICAL DATA:  Scrotal edema EXAM: ULTRASOUND OF SCROTUM TECHNIQUE: Complete ultrasound examination of the testicles, epididymis, and other scrotal structures was performed. COMPARISON:  None FINDINGS: Right testicle Measurements: 3.9 x 1.7 x 2.9 cm.  Normal morphology without mass. Left testicle Measurements: 3.5 x 1.9 x 2.7 cm. Mildly inhomogeneous echogenicity without mass. Right epididymis:  Normal in size and appearance. Left epididymis:  Mildly edematous. Hydrocele:  None visualized. Varicocele: None visualized Other findings: Numerous tiny foci of bright echogenicity throughout the soft tissues in the LEFT hemiscrotum associated with posterior shadowing and soft tissue swelling. Finding is most consistent with numerous foci of gas within the soft tissues and a diagnosis of Fournier's gangrene. IMPRESSION: Normal appearing testes and epididymi. Soft tissue swelling in particularly the LEFT hemiscrotum associated with numerous bright  echogenic foci which demonstrate posterior acoustic shadowing consistent with gas, representing Fournier's gangrene. Critical Value/emergent results were called by telephone at the time of interpretation on 04/23/2019 at 1658 hrs to providerRHONDA WRIGHT NP, who verbally acknowledged these results. Electronically Signed   By: Ulyses Southward M.D.   On: 04/23/2019 17:01   US Renal  Result Date: 04/26/2019 CLINICAL DATA:  Acute kidney injury. COVID-19. Fournier's gangrene. EXAM: RENAL / URINARY TRACT ULTRASOUND COMPLETE COMPARISON:  CT scan of the abdomen dated  04/26/2019 FINDINGS: Right Kidney: Renal measurements: 13.4 x 6.2 x 6.9 cm = volume: 276 mL . Echogenicity within normal limits. No mass or hydronephrosis visualized. Left Kidney: Renal measurements: 12.5 x 6.7 x 7.3 cm = volume: 317 mL. Echogenicity within normal limits. No mass or hydronephrosis visualized. Bladder: Bladder is empty with a Foley catheter in place. Other: None. IMPRESSION: Normal appearing kidneys. Electronically Signed   By: Francene Boyers M.D.   On: 04/26/2019 13:13   Dg Chest Port 1 View  Result Date: 04/24/2019 CLINICAL DATA:  Central line placement EXAM: PORTABLE CHEST 1 VIEW COMPARISON:  April 24, 2019 FINDINGS: The endotracheal tube terminates above the carina by approximately 4.7 cm. There is a left-sided central venous catheter with tip terminating over the proximal SVC. There is a right-sided central venous catheter with tip terminating over the mid to distal SVC. Multifocal airspace opacities are again noted with some interval progression from the prior study. The heart size remains enlarged. There is no pneumothorax. No large pleural effusion. IMPRESSION: 1. Lines and tubes as above.  No pneumothorax. 2. Persistent diffuse bilateral pulmonary opacities which may be secondary to multifocal pneumonia or pulmonary edema. 3. Cardiomegaly. Electronically Signed   By: Katherine Mantle M.D.   On: 04/24/2019 18:34   Dg Chest Port 1 View  Result Date: 04/24/2019 CLINICAL DATA:  Acute respiratory failure. EXAM: PORTABLE CHEST 1 VIEW COMPARISON:  No prior. FINDINGS: Endotracheal tube noted with tip 3 cm above the carina. NG tube noted with tip below left hemidiaphragm. Right IJ line noted with tip over SVC. Cardiomegaly. Diffuse bilateral pulmonary infiltrates and/or edema. Bibasilar atelectasis. No pleural effusion or pneumothorax. IMPRESSION: 1.  Lines and tubes in good anatomic position.  No pneumothorax. 2.  Cardiomegaly.  No pulmonary venous congestion. 3. Diffuse bilateral  pulmonary infiltrates and/or edema. Low lung volumes with bibasilar atelectasis. Electronically Signed   By: Maisie Fus  Register   On: 04/24/2019 08:03   Dg Chest Port 1 View  Result Date: 04/21/2019 CLINICAL DATA:  Respiratory failure.  COVID positive. EXAM: PORTABLE CHEST 1 VIEW COMPARISON:  A tiny 120 FINDINGS: 0513 hours. Low lung volumes. Diffuse interstitial and patchy airspace disease is similar to prior. No pleural effusion. The cardio pericardial silhouette is enlarged. Endotracheal tube tip is 1.6 cm above the base of the carina. NG tube tip is positioned in the distal stomach. Right IJ central line tip projects over the distal SVC level. Telemetry leads overlie the chest. IMPRESSION: 1. No substantial interval change. 2. Cardiomegaly with patchy bilateral airspace disease. Electronically Signed   By: Kennith Center M.D.   On: 04/21/2019 08:54   Dg Chest Port 1 View  Result Date: 04/20/2019 CLINICAL DATA:  Central line placement EXAM: PORTABLE CHEST 1 VIEW COMPARISON:  04/18/2019 chest radiograph. FINDINGS: Right internal jugular central venous catheter terminates in the lower third of the SVC. Endotracheal tube tip is 2.7 cm above the carina. Enteric tube enters stomach with the tip not seen on this image. Stable  cardiomediastinal silhouette with mild cardiomegaly. No pneumothorax. No pleural effusion. Patchy opacities throughout both lungs are unchanged. Low lung volumes. IMPRESSION: 1. Right internal jugular central venous catheter terminates in the lower third of the SVC. No pneumothorax. 2. Endotracheal tube tip is 2.7 cm above the carina. 3. Stable patchy opacities throughout both lungs, compatible with multifocal pneumonia. Electronically Signed   By: Delbert Phenix M.D.   On: 04/20/2019 19:49   Dg Chest Port 1 View  Result Date: 04/18/2019 CLINICAL DATA:  Fever EXAM: PORTABLE CHEST 1 VIEW COMPARISON:  04/17/2019 FINDINGS: No significant change in AP portable examination with moderate,  diffuse bilateral interstitial pulmonary opacity, consistent with multifocal infection or edema. No new airspace opacity. Unchanged support apparatus including endotracheal tube and esophagogastric tube. Cardiomegaly. IMPRESSION: 1. No significant change in AP portable examination with moderate, diffuse bilateral interstitial pulmonary opacity, consistent with multifocal infection or edema. No new airspace opacity. 2. Unchanged support apparatus including endotracheal tube and esophagogastric tube. 3.  Cardiomegaly. Electronically Signed   By: Lauralyn Primes M.D.   On: 04/18/2019 08:56   Dg Chest Port 1 View  Result Date: 04/17/2019 CLINICAL DATA:  Intubation, pneumonia due to COVID-19 EXAM: PORTABLE CHEST 1 VIEW COMPARISON:  Portable exam 0640 hours compared to 04/16/2019 FINDINGS: Tip of endotracheal tube projects 5.2 cm above carina. Nasogastric tube extends into stomach. Enlargement of cardiac silhouette. Diffuse BILATERAL pulmonary infiltrates again identified, minimally improved. No pleural effusion or pneumothorax. IMPRESSION: Slightly improved pulmonary infiltrates. Electronically Signed   By: Ulyses Southward M.D.   On: 04/17/2019 08:51   Portable Chest X-ray  Result Date: 04/16/2019 CLINICAL DATA:  Endotracheal tube placement. EXAM: PORTABLE CHEST 1 VIEW COMPARISON:  April 14, 2019. FINDINGS: Stable cardiomediastinal silhouette. Endotracheal and nasogastric tubes are in grossly good position. No pneumothorax or pleural effusion is noted. Stable bilateral lung opacities are noted concerning for multifocal pneumonia. Bony thorax is unremarkable. IMPRESSION: Endotracheal and nasogastric tubes are in grossly good position. Stable bilateral lung opacities are noted concerning for multifocal pneumonia, potentially of viral etiology. Electronically Signed   By: Lupita Raider M.D.   On: 04/16/2019 13:45   Dg Chest Port 1 View  Result Date: 04/18/2019 CLINICAL DATA:  Shortness of breath, fever,  weakness EXAM: PORTABLE CHEST 1 VIEW COMPARISON:  11/11/2018 FINDINGS: Cardiomegaly. Extensive bilateral interstitial and heterogeneous pulmonary opacity. The visualized skeletal structures are unremarkable. IMPRESSION: 1. Extensive bilateral interstitial and heterogeneous pulmonary opacity, consistent with multifocal infection. 2.  Cardiomegaly. Electronically Signed   By: Lauralyn Primes M.D.   On: 04/18/2019 17:54   US Scrotum Doppler  Result Date: 04/26/2019 CLINICAL DATA:  Scrotal edema EXAM: SCROTAL ULTRASOUND DOPPLER ULTRASOUND OF THE TESTICLES TECHNIQUE: Complete ultrasound examination of the testicles, epididymis, and other scrotal structures was performed. Color and spectral Doppler ultrasound were also utilized to evaluate blood flow to the testicles. COMPARISON:  Same-day CT abdomen pelvis, scrotal ultrasound, 04/23/2019 FINDINGS: Right testicle Measurements: 3.8 x 2.1 x 2.8 cm. No mass or microlithiasis visualized. Left testicle Measurements: 3.4 x 2.0 x 2.7 cm. No mass or microlithiasis visualized. Right epididymis:  Normal in size and appearance. Left epididymis:  Mildly enlarged. Hydrocele:  None visualized. Varicocele:  None visualized. Other: Air and fluid collection about the lateral aspect of the left testicle, predominantly obscured by shadowing air foci. Shadowing air foci similarly within left groin. Extensive thickening of the scrotal wall. Pulsed Doppler interrogation of both testes demonstrates hyperemic, low resistance arterial and venous waveforms bilaterally. IMPRESSION: 1. Best boy  and fluid collection about the lateral aspect of the left testicle, predominantly obscured by shadowing air foci. Shadowing air foci similarly within the left groin. 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 2. Mild, nonspecific  enlargement of the left epididymis. The testicles proper are normal in appearance with symmetric bilateral hyperemic Doppler flow. 3.  Extensive thickening of the scrotal wall. Electronically Signed   By: Lauralyn Primes M.D.   On: 04/26/2019 13:32   Vas [place] Lower Extremity Venous (dvt)  Result Date: 04/27/2019  Lower Venous Study Indications: Edema.  Risk Factors: COVID 19 positive. Limitations: Poor ultrasound/tissue interface, body habitus and patient immobility. Comparison Study: No prior studies. Performing Technologist: Chanda Busing RVT  Examination Guidelines: A complete evaluation includes B-mode imaging, spectral Doppler, color Doppler, and power Doppler as needed of all accessible portions of each vessel. Bilateral testing is considered an integral part of a complete examination. Limited examinations for reoccurring indications may be performed as noted.  +---------+---------------+---------+-----------+----------+--------------+ RIGHT    CompressibilityPhasicitySpontaneityPropertiesThrombus Aging +---------+---------------+---------+-----------+----------+--------------+ CFV      Full           Yes      Yes                                 +---------+---------------+---------+-----------+----------+--------------+ SFJ      Full                                                        +---------+---------------+---------+-----------+----------+--------------+ FV Prox  Full                                                        +---------+---------------+---------+-----------+----------+--------------+ FV Mid   Full                                                        +---------+---------------+---------+-----------+----------+--------------+ FV DistalFull                                                        +---------+---------------+---------+-----------+----------+--------------+ PFV      Full                                                         +---------+---------------+---------+-----------+----------+--------------+ POP      Full           Yes      Yes                                 +---------+---------------+---------+-----------+----------+--------------+  PTV      Full                                                        +---------+---------------+---------+-----------+----------+--------------+ PERO     Full                                                        +---------+---------------+---------+-----------+----------+--------------+   +---------+---------------+---------+-----------+----------+--------------+ LEFT     CompressibilityPhasicitySpontaneityPropertiesThrombus Aging +---------+---------------+---------+-----------+----------+--------------+ CFV      Full           Yes      Yes                                 +---------+---------------+---------+-----------+----------+--------------+ SFJ      Full                                                        +---------+---------------+---------+-----------+----------+--------------+ FV Prox  Full                                                        +---------+---------------+---------+-----------+----------+--------------+ FV Mid   Full                                                        +---------+---------------+---------+-----------+----------+--------------+ FV DistalFull                                                        +---------+---------------+---------+-----------+----------+--------------+ PFV      Full                                                        +---------+---------------+---------+-----------+----------+--------------+ POP      Full           Yes      Yes                                 +---------+---------------+---------+-----------+----------+--------------+ PTV      Full                                                         +---------+---------------+---------+-----------+----------+--------------+  PERO     Full                                                        +---------+---------------+---------+-----------+----------+--------------+     Summary: Right: There is no evidence of deep vein thrombosis in the lower extremity. No cystic structure found in the popliteal fossa. Left: There is no evidence of deep vein thrombosis in the lower extremity. No cystic structure found in the popliteal fossa.  *See table(s) above for measurements and observations. Electronically signed by Gretta Beganodd Early MD on 04/27/2019 at 9:23:02 AM.    Final     Microbiology Recent Results (from the past 240 hour(s))  Culture, blood (routine x 2)     Status: None (Preliminary result)   Collection Time: 04/20/19  9:28 AM   Specimen: BLOOD  Result Value Ref Range Status   Specimen Description   Final    BLOOD RIGHT HAND Performed at [facility], [address] [place], [place] [address]    Special Requests   Final    BOTTLES DRAWN AEROBIC ONLY Blood Culture adequate volume Performed at [facility], [address] [place], [place] [address]    Culture   Final    NO GROWTH 2 DAYS Performed at [facility], [address] [place], [place] [address]    Report Status PENDING  Incomplete  Culture, blood (routine x 2)     Status: None (Preliminary result)   Collection Time: 04/20/19  9:32 AM   Specimen: BLOOD  Result Value Ref Range Status   Specimen Description   Final    BLOOD RIGHT ANTECUBITAL Performed at [facility], [address] [place], [place] [address]    Special Requests   Final    BOTTLES DRAWN AEROBIC ONLY Blood Culture adequate volume Performed at [facility], [address] [place], [place] [address]    Culture   Final    NO GROWTH 2 DAYS Performed at [facility], [address] [place], [place] [address]    Report  Status PENDING  Incomplete  Culture, respiratory (non-expectorated)     Status: None   Collection Time: 04/20/19  9:47 AM   Specimen: Tracheal Aspirate; Respiratory  Result Value Ref Range Status   Specimen Description   Final    TRACHEAL ASPIRATE Performed at [facility], [address] [place], [place] [address]    Special Requests   Final    Normal Performed at [facility], [address] [place], [place] [address]    Gram Stain   Final    ABUNDANT WBC PRESENT, PREDOMINANTLY PMN NO ORGANISMS SEEN Performed at [facility], [address] [place], [place] [address]    Culture RARE KLEBSIELLA PNEUMONIAE  Final   Report Status 04/16/2019 FINAL  Final   Organism ID, Bacteria KLEBSIELLA PNEUMONIAE  Final      Susceptibility   Klebsiella pneumoniae - MIC*    AMPICILLIN >=32 RESISTANT Resistant     CEFAZOLIN <=4 SENSITIVE Sensitive     CEFEPIME <=1 SENSITIVE Sensitive     CEFTAZIDIME <=1 SENSITIVE Sensitive     CEFTRIAXONE <=1 SENSITIVE Sensitive     CIPROFLOXACIN <=0.25 SENSITIVE Sensitive     GENTAMICIN <=1 SENSITIVE Sensitive  IMIPENEM <=0.25 SENSITIVE Sensitive     TRIMETH/SULFA <=20 SENSITIVE Sensitive     AMPICILLIN/SULBACTAM 8 SENSITIVE Sensitive     PIP/TAZO <=4 SENSITIVE Sensitive     Extended ESBL NEGATIVE Sensitive     * RARE KLEBSIELLA PNEUMONIAE  Culture, blood (routine x 2)     Status: None   Collection Time: 04/23/19  6:14 PM   Specimen: BLOOD  Result Value Ref Range Status   Specimen Description   Final    BLOOD LEFT ANTECUBITAL Performed at [facility], [address] [place], [place] [address]    Special Requests   Final    BOTTLES DRAWN AEROBIC ONLY Blood Culture results may not be optimal due to an inadequate volume of blood received in culture bottles Performed at [facility], [address] [place], [place] [address]    Culture   Final    NO GROWTH 5 DAYS  Performed at [facility], [address] [place], [place] [address]    Report Status 04/16/2019 FINAL  Final  Culture, blood (routine x 2)     Status: None   Collection Time: 04/23/19  6:22 PM   Specimen: BLOOD LEFT HAND  Result Value Ref Range Status   Specimen Description   Final    BLOOD LEFT HAND Performed at [facility], [address], [place], [place] [address]    Special Requests   Final    BOTTLES DRAWN AEROBIC ONLY Blood Culture adequate volume Performed at [facility], [address], [place], [place] [address]    Culture   Final    NO GROWTH 5 DAYS Performed at [facility], [address] [place], [place] [address]    Report Status 04/23/2019 FINAL  Final    Lab Basic Metabolic Panel: Recent Labs  Lab 04/24/19 0456 04/25/19 0545 04/25/19 0958  04/25/19 1547 04/26/19 0258 04/26/19 0445 04/26/19 1615 04/27/19 0545 04/27/19 0546  NA 152* 143 143   < > 143 139 141  139 139  --  141  K 4.9 4.2 4.3   < > 4.4 4.2 4.3  4.3 4.5  --  4.7  CL 117* 109 108  --  107  --  104  103 104  --  103  CO2 --  25  --  --  25  GLUCOSE 167* 180* 139*  --  166*  --  124*  119* 134*  --  64*  BUN 143* 96* 85*  --  78*  --  61*  62* 64*  --  53*  CREATININE 4.27* 2.80* 2.41*  --  2.37*  --  1.99*  1.97* 1.98*  --  1.74*  CALCIUM 6.9* 7.3* 6.6*  --  7.3*  --  7.9*  7.8* 7.7*  --  8.0*  MG 2.7* 2.6* 2.3  --   --   --  2.6*  --  2.8*  --   PHOS  --  4.0 3.8  --  4.9*  --  4.7*  4.7* 4.3  --  4.5   < > = values in this interval not displayed.   Liver Function Tests: Recent Labs  Lab 04/25/19 0958 04/25/19 1547 04/26/19 0445 04/26/19 1615 04/27/19 0545 04/27/19 0546  AST 38  --  40  --  142*  --   ALT 22  --  24  --  52*  --  ALKPHOS 128*  --  122  --  153*  --   BILITOT 0.4  --  0.4  --  0.4  --   PROT 5.9*  --  6.1*  --  6.2*  --   ALBUMIN 1.5* 1.6* 1.6*  1.6* 1.8* 1.7* 1.8*   No  results for input(s): LIPASE, AMYLASE in the last 168 hours. No results for input(s): AMMONIA in the last 168 hours. CBC: Recent Labs  Lab 04/24/19 0456 04/25/19 0545 04/25/19 0958 04/25/19 1220 04/26/19 0258 04/26/19 0445 04/27/19 0545  WBC 16.0* 12.2* 13.2*  --   --  13.2* 24.0*  NEUTROABS 13.5*  --  11.1*  --   --  10.9* 19.1*  HGB 8.9* 9.2* 9.5* 10.9* 10.5* 9.0* 8.8*  HCT 32.2* 32.2* 33.5* 32.0* 31.0* 31.7* 30.9*  MCV 74.9* 71.7* 71.6*  --   --  71.7* 71.0*  PLT 280 257 257  --   --  218 329   Cardiac Enzymes: No results for input(s): CKTOTAL, CKMB, CKMBINDEX, TROPONINI in the last 168 hours. Sepsis Labs: Recent Labs  Lab 04/23/19 1740 04/23/19 2024  04/25/19 0545 04/25/19 0958 04/26/19 0445 04/27/19 0545  PROCALCITON  --   --   --   --  12.23 9.38 4.76  WBC  --   --    < > 12.2* 13.2* 13.2* 24.0*  LATICACIDVEN 1.6 1.8  --   --   --   --   --    < > = values in this interval not displayed.    Procedures/Operations  04/16/2019 endotracheal intubation 04/20/2019 left internal jugular vein central venous catheter unsuccessful attempt 04/20/2019 right internal jugular vein central venous catheter placement 04/24/2019 left internal jugular vein for hemodialysis catheter   Peyton Bottoms 04/27/2019, 8:12 PM   Total clinical and documentation time for today Under 30 minutes   Last Note

## 2019-04-30 NOTE — Progress Notes (Signed)
Spoke with family about potential funeral home and they stated that they didn't have one at this time.

## 2019-04-30 NOTE — Progress Notes (Signed)
Sister, Spencer Richard, called and given an update on patient.  Appreciative of update.

## 2019-04-30 NOTE — Progress Notes (Signed)
Spoke with Lanny Hurst from medical examiner office and informed that this pt is not a medical examiner case.

## 2019-04-30 NOTE — Progress Notes (Signed)
Pt dilaudid 0.5mg /33ml gtt stopped and wasted of 172ml at this time. Pt has expired. Aloha Gell (employee# [phone]) witnessed.

## 2019-04-30 NOTE — Progress Notes (Signed)
PROGRESS NOTE                                                                                                                                                                                                             Patient Demographics:    Spencer Richard, is a 58 y.o. male, DOB - April 15, 1961, WHQ:[id number]  Outpatient Primary MD for the patient is Patient, No Pcp Per   Admit date - 05/21/19   LOS - 6  No chief complaint on file.      Brief Narrative: Patient is a 58 y.o. male  (was identified as Nurse, adult 58yo M on admission) with PMHx of depression, anxiety, hypertension, obesity-alcohol abuse-presented with a 3 to 5-day history of worsening shortness of breath, myalgias, fever, cough-found to have severe hypoxemia secondary to COVID-19 pneumonia-requiring HFNC, admitted to the ICU at [facility]. Further hospital course complicated by worsening hypoxemia requiring intubation, Delrium tremens,afid with rvr,AKI requiring CRRT, fournier's gangrene. Given poor overall prognoses, PCCM d/w sister/brother in law, and after extensive discussion-transitioned to full comfort measures.  Significant Events: 11/15-Admit 11/17- Intubation for respiratory failure, severe bradycardia on precedex requiring atropine 11/21- Persistent fevers, Rapid afib, started amio 11/22- Converted to NSR 11/24-scrotal edema concerning for gas-forming severe epididymorchitis 11/25>>CRRT 11/28>>transitioned to comfort care   Subjective:    Tora Perches today remains sedated-comfortable   Assessment  & Plan :   Acute Hypoxic Resp Failure due to ARDS from Covid 19 Viral pneumonia and superimposed Klebsiella pneumonia:  :transitioned to full comfort measures.   AKI: hemodynamically mediated-no longer on CRRT-transitioned to full comfort measures.  PAF with RVR: no longer on tele-comfort measures in effect  Shock, distributive due to sepsis/fournier's  gangrene and sedation: did require low-dose pressors-now full comfort care   Fournier's gangrene:followed by Urology-family decided against surgical intervention-no longer on Abx-as full comfort measures in effect  Etoh Withdrawal with Delirium Tremens:  ABG:    Component Value Date/Time   PHART 7.270 (L) 04/26/2019 0258   PCO2ART 58.2 (H) 04/26/2019 0258   PO2ART 78.0 (L) 04/26/2019 0258   HCO3 26.8 04/26/2019 0258   TCO2 29 04/26/2019 0258   ACIDBASEDEF 1.0 04/26/2019 0258   O2SAT 93.0 04/26/2019 0258    Vent Settings: N/A  Condition - poor   Family Communication  :   Code Status :  DNR  Diet :  Diet Order    None       Disposition Plan  :  Expect inpatient death  Procedures: ETT May 03, 2023 > 11/28 R IJ CVL 11/21>  Trialysis L IJ 11/25>  Consults:   PCCM Urology  Nephrology    Barriers to discharge: Hypoxia requiring O2 supplementation/complete 5 days of IV Remdesivir  Antimicorbials  :    Anti-infectives (From admission, onward)   Start     Dose/Rate Route Frequency Ordered Stop   04/26/19 1300  clindamycin (CLEOCIN) IVPB 900 mg  Status:  Discontinued     900 mg 100 mL/hr over 30 Minutes Intravenous Every 8 hours 04/26/19 1223 04/27/19 1240   04/25/19 1900  vancomycin (VANCOCIN) IVPB 1000 mg/200 mL premix  Status:  Discontinued     1,000 mg 200 mL/hr over 60 Minutes Intravenous Every 48 hours 04/23/19 1738 04/24/19 1922   04/24/19 2200  vancomycin (VANCOCIN) IVPB 1000 mg/200 mL premix  Status:  Discontinued     1,000 mg 200 mL/hr over 60 Minutes Intravenous Every 24 hours 04/24/19 1922 04/27/19 1240   04/24/19 1027  ceFEPIme (MAXIPIME) 2 g in sodium chloride 0.9 % 100 mL IVPB  Status:  Discontinued     2 g 200 mL/hr over 30 Minutes Intravenous Every 24 hours 04/23/19 1518 04/23/19 1705   04/24/19 0600  meropenem (MERREM) 1 g in sodium chloride 0.9 % 100 mL IVPB  Status:  Discontinued     1 g 200 mL/hr over 30 Minutes Intravenous Every 12 hours 04/23/19  1738 04/27/19 1240   04/23/19 1900  vancomycin (VANCOCIN) 1,500 mg in sodium chloride 0.9 % 500 mL IVPB     1,500 mg 250 mL/hr over 120 Minutes Intravenous  Once 04/23/19 1738 04/23/19 2044   04/23/19 1800  meropenem (MERREM) 2 g in sodium chloride 0.9 % 100 mL IVPB     2 g 200 mL/hr over 30 Minutes Intravenous  Once 04/23/19 1738 04/23/19 1859   May 09, 2019 2200  ceFEPIme (MAXIPIME) 2 g in sodium chloride 0.9 % 100 mL IVPB  Status:  Discontinued     2 g 200 mL/hr over 30 Minutes Intravenous Every 12 hours 05-09-19 2041 04/23/19 1518      Inpatient Medications  Scheduled Meds:  amiodarone  200 mg Per Tube BID   artificial tears  1 application Both Eyes Q8H   sodium chloride flush  10-40 mL Intracatheter Q12H   Continuous Infusions:  sodium chloride 250 mL (04/10/2019 0550)   HYDROmorphone 3 mg/hr (04/27/19 2328)   PRN Meds:.sodium chloride, acetaminophen, diphenhydrAMINE, glycopyrrolate **OR** glycopyrrolate **OR** glycopyrrolate, haloperidol lactate, HYDROmorphone, LORazepam, LORazepam, metoprolol tartrate, midazolam, polyvinyl alcohol, sodium chloride flush   Time Spent in minutes  25  See all Orders from today for further details   Jeoffrey Massed M.D on 04/12/2019 at 2:57 PM  To page go to [email or web address] - use universal password  [facility] -  Office  [phone]    Objective:   Vitals:   04/27/19 1900 04/27/19 2000 04/10/2019 0500 04/21/2019 0738  BP: 108/60   (!) 72/38  Pulse: (!) 108 (!) 117  (!) 106  Resp: (!) 30   16  Temp: (!) 103.3 F (39.6 C)  98.7 F (37.1 C) 99 F (37.2 C)  TempSrc: Axillary  Axillary Axillary  SpO2: (!) 50%   (!) 83%  Weight:      Height:        Wt Readings from Last 3 Encounters:  04/27/19 97.4 kg  04/11/2019 82 kg     Intake/Output Summary (Last 24 hours) at 04/23/2019 1457 Last data filed at 04/23/2019 0630 Gross per 24 hour  Intake 267.24 ml  Output 500 ml  Net -232.76 ml     Physical Exam Appears  comfortable Scrotum enlarged-dark    Data Review:    CBC Recent Labs  Lab 04/24/19 0456 04/25/19 0545 04/25/19 0958 04/25/19 1220 04/26/19 0258 04/26/19 0445 04/27/19 0545  WBC 16.0* 12.2* 13.2*  --   --  13.2* 24.0*  HGB 8.9* 9.2* 9.5* 10.9* 10.5* 9.0* 8.8*  HCT 32.2* 32.2* 33.5* 32.0* 31.0* 31.7* 30.9*  PLT 280 257 257  --   --  218 329  MCV 74.9* 71.7* 71.6*  --   --  71.7* 71.0*  MCH 20.7* 20.5* 20.3*  --   --  20.4* 20.2*  MCHC 27.6* 28.6* 28.4*  --   --  28.4* 28.5*  RDW 21.0* 20.8* 21.2*  --   --  21.0* 20.2*  LYMPHSABS 0.9  --  0.8  --   --  0.9 1.6  MONOABS 1.3*  --  0.9  --   --  0.9 1.6*  EOSABS 0.1  --  0.0  --   --  0.0 0.0  BASOSABS 0.1  --  0.1  --   --  0.1 0.0    Chemistries  Recent Labs  Lab 04/24/19 0456 04/25/19 0545 04/25/19 0958  04/25/19 1547 04/26/19 0258 04/26/19 0445 04/26/19 1615 04/27/19 0545 04/27/19 0546  NA 152* 143 143   < > 143 139 141   139 139  --  141  K 4.9 4.2 4.3   < > 4.4 4.2 4.3   4.3 4.5  --  4.7  CL 117* 109 108  --  107  --  104   103 104  --  103  CO2 --  25  --  --  25  GLUCOSE 167* 180* 139*  --  166*  --  124*   119* 134*  --  64*  BUN 143* 96* 85*  --  78*  --  61*   62* 64*  --  53*  CREATININE 4.27* 2.80* 2.41*  --  2.37*  --  1.99*   1.97* 1.98*  --  1.74*  CALCIUM 6.9* 7.3* 6.6*  --  7.3*  --  7.9*   7.8* 7.7*  --  8.0*  MG 2.7* 2.6* 2.3  --   --   --  2.6*  --  2.8*  --   AST  --   --  38  --   --   --  40  --  142*  --   ALT  --   --  22  --   --   --  24  --  52*  --   ALKPHOS  --   --  128*  --   --   --  122  --  153*  --   BILITOT  --   --  0.4  --   --   --  0.4  --  0.4  --    < > = values in this interval not displayed.   ------------------------------------------------------------------------------------------------------------------ No results for input(s): CHOL, HDL, LDLCALC, TRIG, CHOLHDL, LDLDIRECT in the last 72 hours.  Lab Results  Component Value Date   HGBA1C 6.7  (H) 04/23/2019   ------------------------------------------------------------------------------------------------------------------ No results for  input(s): TSH, T4TOTAL, T3FREE, THYROIDAB in the last 72 hours.  Invalid input(s): FREET3 ------------------------------------------------------------------------------------------------------------------ No results for input(s): VITAMINB12, FOLATE, FERRITIN, TIBC, IRON, RETICCTPCT in the last 72 hours.  Coagulation profile No results for input(s): INR, PROTIME in the last 168 hours.  Recent Labs    04/26/19 0445 04/27/19 0545  DDIMER 3.31* 2.90*    Cardiac Enzymes No results for input(s): CKMB, TROPONINI, MYOGLOBIN in the last 168 hours.  Invalid input(s): CK ------------------------------------------------------------------------------------------------------------------    Component Value Date/Time   BNP 597.7 (H) 04/21/2019 1005    Micro Results Recent Results (from the past 240 hour(s))  Culture, blood (routine x 2)     Status: None (Preliminary result)   Collection Time: 04/20/19  9:28 AM   Specimen: BLOOD  Result Value Ref Range Status   Specimen Description   Final    BLOOD RIGHT HAND Performed at [facility], [address], [place], [place] [address]    Special Requests   Final    BOTTLES DRAWN AEROBIC ONLY Blood Culture adequate volume Performed at [facility], [address], [place], [place] [address]    Culture   Final    NO GROWTH 2 DAYS Performed at [facility], [address], [place], [place] [address]    Report Status PENDING  Incomplete  Culture, blood (routine x 2)     Status: None (Preliminary result)   Collection Time: 04/20/19  9:32 AM   Specimen: BLOOD  Result Value Ref Range Status   Specimen Description   Final    BLOOD RIGHT ANTECUBITAL Performed at [facility], [address] [place], [place] [address]    Special Requests    Final    BOTTLES DRAWN AEROBIC ONLY Blood Culture adequate volume Performed at [facility], [address], [place], [place] [address]    Culture   Final    NO GROWTH 2 DAYS Performed at [facility], [address], [place], [place] [address]    Report Status PENDING  Incomplete  Culture, respiratory (non-expectorated)     Status: None   Collection Time: 04/20/19  9:47 AM   Specimen: Tracheal Aspirate; Respiratory  Result Value Ref Range Status   Specimen Description   Final    TRACHEAL ASPIRATE Performed at [facility], [address] [place], [place] [address]    Special Requests   Final    Normal Performed at [facility], [address] [place], [place] [address]    Gram Stain   Final    ABUNDANT WBC PRESENT, PREDOMINANTLY PMN NO ORGANISMS SEEN Performed at [facility], [address] [place], [place] [address]    Culture RARE KLEBSIELLA PNEUMONIAE  Final   Report Status 04/27/2019 FINAL  Final   Organism ID, Bacteria KLEBSIELLA PNEUMONIAE  Final      Susceptibility   Klebsiella pneumoniae - MIC*    AMPICILLIN >=32 RESISTANT Resistant     CEFAZOLIN <=4 SENSITIVE Sensitive     CEFEPIME <=1 SENSITIVE Sensitive     CEFTAZIDIME <=1 SENSITIVE Sensitive     CEFTRIAXONE <=1 SENSITIVE Sensitive     CIPROFLOXACIN <=0.25 SENSITIVE Sensitive     GENTAMICIN <=1 SENSITIVE Sensitive     IMIPENEM <=0.25 SENSITIVE Sensitive     TRIMETH/SULFA <=20 SENSITIVE Sensitive     AMPICILLIN/SULBACTAM 8 SENSITIVE Sensitive     PIP/TAZO <=4 SENSITIVE Sensitive     Extended ESBL NEGATIVE  Sensitive     * RARE KLEBSIELLA PNEUMONIAE  Culture, blood (routine x 2)     Status: None   Collection Time: 04/23/19  6:14 PM   Specimen: BLOOD  Result Value Ref Range Status   Specimen Description   Final    BLOOD LEFT ANTECUBITAL Performed at [facility], [address], [place], [place] [address]     Special Requests   Final    BOTTLES DRAWN AEROBIC ONLY Blood Culture results may not be optimal due to an inadequate volume of blood received in culture bottles Performed at [facility], [address] [place], [place] [address]    Culture   Final    NO GROWTH 5 DAYS Performed at [facility], [address] [place], [place] [address]    Report Status April 30, 2019 FINAL  Final  Culture, blood (routine x 2)     Status: None   Collection Time: 04/23/19  6:22 PM   Specimen: BLOOD LEFT HAND  Result Value Ref Range Status   Specimen Description   Final    BLOOD LEFT HAND Performed at [facility], [address], [place], [place] [address]    Special Requests   Final    BOTTLES DRAWN AEROBIC ONLY Blood Culture adequate volume Performed at [facility], [address], [place], [place] [address]    Culture   Final    NO GROWTH 5 DAYS Performed at [facility], [address] [place], [place] [address]    Report Status 04/30/2019 FINAL  Final    Radiology Reports Ct Abdomen Pelvis Wo Contrast  Result Date: 04/26/2019 CLINICAL DATA:  Scrotal mass, Fournier's gangrene suspected by ultrasound EXAM: CT ABDOMEN AND PELVIS WITHOUT CONTRAST TECHNIQUE: Multidetector CT imaging of the abdomen and pelvis was performed following the standard protocol without IV contrast. COMPARISON:  Scrotal ultrasound, 04/23/2019 FINDINGS: Lower chest: There are trace bilateral pleural effusions and associated atelectasis or consolidation. Hepatobiliary: No solid liver abnormality is seen. No gallstones, gallbladder wall thickening, or biliary dilatation. Pancreas: Unremarkable. No pancreatic ductal dilatation or surrounding inflammatory changes. Spleen: Normal in size without significant abnormality. Adrenals/Urinary Tract: Adrenal glands are unremarkable. Kidneys are normal, without renal calculi, solid lesion, or hydronephrosis. Foley catheter  within the urinary bladder. Stomach/Bowel: Stomach is within normal limits. Esophagogastric tube is positioned with tip and side port below the diaphragm, tip within the descending duodenum. Appendix appears normal. No evidence of bowel wall thickening, distention, or inflammatory changes. Pancolonic diverticulosis. Rectal tube within the rectum. Vascular/Lymphatic: No significant vascular findings are present. No enlarged abdominal or pelvic lymph nodes. Reproductive: There is extensive scrotal edema and florid gas formation within the left scrotum, tracking up through the inguinal canal, into the subcutaneous soft tissues of the inguinal fold, and into the abdominal cavity, a small air and fluid collection about the sigmoid colon and left testicular vein measuring 5.5 x 4.4 x 2.5 cm (series 4, image 71, series 603, image 150). There is extensive extraluminal gas within the small bowel mesentery and mesocolon (series 4, image 71). Other: Anasarca.  No abdominopelvic ascites. Musculoskeletal: No acute or significant osseous findings. IMPRESSION: 1. Extensive scrotal edema and florid gas formation within the left scrotum, tracking up through the inguinal canal, into the subcutaneous soft tissues of the inguinal fold, and into the abdominal cavity, a 5.5 x 4.4 x 2.5 cm air and fluid collection about the sigmoid colon and left testicular vein measuring 5.5  x 4.4 cm. Findings remain consistent with Fournier's gangrene as previously seen by scrotal ultrasound, now with intra-abdominal extent demonstrated by CT. 2. Trace bilateral pleural effusions and associated atelectasis or consolidation of the included lung bases. 3. Esophagogastric tube is positioned with tip and side port below the diaphragm, tip within the descending duodenum. Consider retraction for intragastric tip position. These results will be called to the ordering clinician or representative by the Radiologist Assistant, and communication documented in the  PACS or zVision Dashboard. Electronically Signed   By: Lauralyn Primes M.D.   On: 04/26/2019 11:52   Dg Abd 1 View  Result Date: 04/25/2019 CLINICAL DATA:  Ileus. EXAM: ABDOMEN - 1 VIEW COMPARISON:  None. FINDINGS: Nasogastric tube extends through the stomach and into the region of the duodenal bulb/proximal descending duodenum. Bowel gas pattern demonstrates no significant dilated bowel loops or obstructive pattern. No gross signs of free air. No abnormal calcifications. IMPRESSION: Nasogastric tube tip lies in the region of the duodenal bulb/proximal descending duodenum. Normal bowel gas pattern. Electronically Signed   By: Irish Lack M.D.   On: 04/25/2019 11:25   US Scrotum  Result Date: 04/23/2019 CLINICAL DATA:  Scrotal edema EXAM: ULTRASOUND OF SCROTUM TECHNIQUE: Complete ultrasound examination of the testicles, epididymis, and other scrotal structures was performed. COMPARISON:  None FINDINGS: Right testicle Measurements: 3.9 x 1.7 x 2.9 cm.  Normal morphology without mass. Left testicle Measurements: 3.5 x 1.9 x 2.7 cm. Mildly inhomogeneous echogenicity without mass. Right epididymis:  Normal in size and appearance. Left epididymis:  Mildly edematous. Hydrocele:  None visualized. Varicocele: None visualized Other findings: Numerous tiny foci of bright echogenicity throughout the soft tissues in the LEFT hemiscrotum associated with posterior shadowing and soft tissue swelling. Finding is most consistent with numerous foci of gas within the soft tissues and a diagnosis of Fournier's gangrene. IMPRESSION: Normal appearing testes and epididymi. Soft tissue swelling in particularly the LEFT hemiscrotum associated with numerous bright echogenic foci which demonstrate posterior acoustic shadowing consistent with gas, representing Fournier's gangrene. Critical Value/emergent results were called by telephone at the time of interpretation on 04/23/2019 at 1658 hrs to providerRHONDA WRIGHT NP, who verbally  acknowledged these results. Electronically Signed   By: Ulyses Southward M.D.   On: 04/23/2019 17:01   US Renal  Result Date: 04/26/2019 CLINICAL DATA:  Acute kidney injury. COVID-19. Fournier's gangrene. EXAM: RENAL / URINARY TRACT ULTRASOUND COMPLETE COMPARISON:  CT scan of the abdomen dated 04/26/2019 FINDINGS: Right Kidney: Renal measurements: 13.4 x 6.2 x 6.9 cm = volume: 276 mL . Echogenicity within normal limits. No mass or hydronephrosis visualized. Left Kidney: Renal measurements: 12.5 x 6.7 x 7.3 cm = volume: 317 mL. Echogenicity within normal limits. No mass or hydronephrosis visualized. Bladder: Bladder is empty with a Foley catheter in place. Other: None. IMPRESSION: Normal appearing kidneys. Electronically Signed   By: Francene Boyers M.D.   On: 04/26/2019 13:13   Dg Chest Port 1 View  Result Date: 04/24/2019 CLINICAL DATA:  Central line placement EXAM: PORTABLE CHEST 1 VIEW COMPARISON:  April 24, 2019 FINDINGS: The endotracheal tube terminates above the carina by approximately 4.7 cm. There is a left-sided central venous catheter with tip terminating over the proximal SVC. There is a right-sided central venous catheter with tip terminating over the mid to distal SVC. Multifocal airspace opacities are again noted with some interval progression from the prior study. The heart size remains enlarged. There is no pneumothorax. No large pleural effusion. IMPRESSION: 1. Lines  and tubes as above.  No pneumothorax. 2. Persistent diffuse bilateral pulmonary opacities which may be secondary to multifocal pneumonia or pulmonary edema. 3. Cardiomegaly. Electronically Signed   By: Katherine Mantle M.D.   On: 04/24/2019 18:34   Dg Chest Port 1 View  Result Date: 04/24/2019 CLINICAL DATA:  Acute respiratory failure. EXAM: PORTABLE CHEST 1 VIEW COMPARISON:  No prior. FINDINGS: Endotracheal tube noted with tip 3 cm above the carina. NG tube noted with tip below left hemidiaphragm. Right IJ line noted with  tip over SVC. Cardiomegaly. Diffuse bilateral pulmonary infiltrates and/or edema. Bibasilar atelectasis. No pleural effusion or pneumothorax. IMPRESSION: 1.  Lines and tubes in good anatomic position.  No pneumothorax. 2.  Cardiomegaly.  No pulmonary venous congestion. 3. Diffuse bilateral pulmonary infiltrates and/or edema. Low lung volumes with bibasilar atelectasis. Electronically Signed   By: Maisie Fus  Register   On: 04/24/2019 08:03   Dg Chest Port 1 View  Result Date: 04/21/2019 CLINICAL DATA:  Respiratory failure.  COVID positive. EXAM: PORTABLE CHEST 1 VIEW COMPARISON:  A tiny 120 FINDINGS: 0513 hours. Low lung volumes. Diffuse interstitial and patchy airspace disease is similar to prior. No pleural effusion. The cardio pericardial silhouette is enlarged. Endotracheal tube tip is 1.6 cm above the base of the carina. NG tube tip is positioned in the distal stomach. Right IJ central line tip projects over the distal SVC level. Telemetry leads overlie the chest. IMPRESSION: 1. No substantial interval change. 2. Cardiomegaly with patchy bilateral airspace disease. Electronically Signed   By: Kennith Center M.D.   On: 04/21/2019 08:54   Dg Chest Port 1 View  Result Date: 04/20/2019 CLINICAL DATA:  Central line placement EXAM: PORTABLE CHEST 1 VIEW COMPARISON:  04/18/2019 chest radiograph. FINDINGS: Right internal jugular central venous catheter terminates in the lower third of the SVC. Endotracheal tube tip is 2.7 cm above the carina. Enteric tube enters stomach with the tip not seen on this image. Stable cardiomediastinal silhouette with mild cardiomegaly. No pneumothorax. No pleural effusion. Patchy opacities throughout both lungs are unchanged. Low lung volumes. IMPRESSION: 1. Right internal jugular central venous catheter terminates in the lower third of the SVC. No pneumothorax. 2. Endotracheal tube tip is 2.7 cm above the carina. 3. Stable patchy opacities throughout both lungs, compatible with  multifocal pneumonia. Electronically Signed   By: Delbert Phenix M.D.   On: 04/20/2019 19:49   Dg Chest Port 1 View  Result Date: 04/18/2019 CLINICAL DATA:  Fever EXAM: PORTABLE CHEST 1 VIEW COMPARISON:  04/17/2019 FINDINGS: No significant change in AP portable examination with moderate, diffuse bilateral interstitial pulmonary opacity, consistent with multifocal infection or edema. No new airspace opacity. Unchanged support apparatus including endotracheal tube and esophagogastric tube. Cardiomegaly. IMPRESSION: 1. No significant change in AP portable examination with moderate, diffuse bilateral interstitial pulmonary opacity, consistent with multifocal infection or edema. No new airspace opacity. 2. Unchanged support apparatus including endotracheal tube and esophagogastric tube. 3.  Cardiomegaly. Electronically Signed   By: Lauralyn Primes M.D.   On: 04/18/2019 08:56   Dg Chest Port 1 View  Result Date: 04/17/2019 CLINICAL DATA:  Intubation, pneumonia due to COVID-19 EXAM: PORTABLE CHEST 1 VIEW COMPARISON:  Portable exam 0640 hours compared to 04/16/2019 FINDINGS: Tip of endotracheal tube projects 5.2 cm above carina. Nasogastric tube extends into stomach. Enlargement of cardiac silhouette. Diffuse BILATERAL pulmonary infiltrates again identified, minimally improved. No pleural effusion or pneumothorax. IMPRESSION: Slightly improved pulmonary infiltrates. Electronically Signed   By: Angelyn Punt.D.  On: 04/17/2019 08:51   Portable Chest X-ray  Result Date: 04/16/2019 CLINICAL DATA:  Endotracheal tube placement. EXAM: PORTABLE CHEST 1 VIEW COMPARISON:  April 14, 2019. FINDINGS: Stable cardiomediastinal silhouette. Endotracheal and nasogastric tubes are in grossly good position. No pneumothorax or pleural effusion is noted. Stable bilateral lung opacities are noted concerning for multifocal pneumonia. Bony thorax is unremarkable. IMPRESSION: Endotracheal and nasogastric tubes are in grossly good  position. Stable bilateral lung opacities are noted concerning for multifocal pneumonia, potentially of viral etiology. Electronically Signed   By: Lupita RaiderJames  Green Jr M.D.   On: 04/16/2019 13:45   Dg Chest Port 1 View  Result Date: 04/16/2019 CLINICAL DATA:  Shortness of breath, fever, weakness EXAM: PORTABLE CHEST 1 VIEW COMPARISON:  11/11/2018 FINDINGS: Cardiomegaly. Extensive bilateral interstitial and heterogeneous pulmonary opacity. The visualized skeletal structures are unremarkable. IMPRESSION: 1. Extensive bilateral interstitial and heterogeneous pulmonary opacity, consistent with multifocal infection. 2.  Cardiomegaly. Electronically Signed   By: Lauralyn PrimesAlex  Bibbey M.D.   On: 04/25/2019 17:54   [place] Scrotum Doppler  Result Date: 04/26/2019 CLINICAL DATA:  Scrotal edema EXAM: SCROTAL ULTRASOUND DOPPLER ULTRASOUND OF THE TESTICLES TECHNIQUE: Complete ultrasound examination of the testicles, epididymis, and other scrotal structures was performed. Color and spectral Doppler ultrasound were also utilized to evaluate blood flow to the testicles. COMPARISON:  Same-day CT abdomen pelvis, scrotal ultrasound, 04/23/2019 FINDINGS: Right testicle Measurements: 3.8 x 2.1 x 2.8 cm. No mass or microlithiasis visualized. Left testicle Measurements: 3.4 x 2.0 x 2.7 cm. No mass or microlithiasis visualized. Right epididymis:  Normal in size and appearance. Left epididymis:  Mildly enlarged. Hydrocele:  None visualized. Varicocele:  None visualized. Other: Air and fluid collection about the lateral aspect of the left testicle, predominantly obscured by shadowing air foci. Shadowing air foci similarly within left groin. Extensive thickening of the scrotal wall. Pulsed Doppler interrogation of both testes demonstrates hyperemic, low resistance arterial and venous waveforms bilaterally. IMPRESSION: 1. Air and fluid collection about the lateral aspect of the left testicle, predominantly obscured by shadowing air foci. Shadowing  air foci similarly within the left groin. 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 2. Mild, nonspecific enlargement of the left epididymis. The testicles proper are normal in appearance with symmetric bilateral hyperemic Doppler flow. 3.  Extensive thickening of the scrotal wall. Electronically Signed   By: Lauralyn PrimesAlex  Bibbey M.D.   On: 04/26/2019 13:32   Vas [place] Lower Extremity Venous (dvt)  Result Date: 04/27/2019  Lower Venous Study Indications: Edema.  Risk Factors: COVID 19 positive. Limitations: Poor ultrasound/tissue interface, body habitus and patient immobility. Comparison Study: No prior studies. Performing Technologist: Chanda BusingGregory Collins RVT  Examination Guidelines: A complete evaluation includes B-mode imaging, spectral Doppler, color Doppler, and power Doppler as needed of all accessible portions of each vessel. Bilateral testing is considered an integral part of a complete examination. Limited examinations for reoccurring indications may be performed as noted.  +---------+---------------+---------+-----------+----------+--------------+  RIGHT     Compressibility Phasicity Spontaneity Properties Thrombus Aging  +---------+---------------+---------+-----------+----------+--------------+  CFV       Full            Yes       Yes                                    +---------+---------------+---------+-----------+----------+--------------+  SFJ       Full                                                             +---------+---------------+---------+-----------+----------+--------------+  FV Prox   Full                                                             +---------+---------------+---------+-----------+----------+--------------+  FV Mid    Full                                                              +---------+---------------+---------+-----------+----------+--------------+  FV Distal Full                                                             +---------+---------------+---------+-----------+----------+--------------+  PFV       Full                                                             +---------+---------------+---------+-----------+----------+--------------+  POP       Full            Yes       Yes                                    +---------+---------------+---------+-----------+----------+--------------+  PTV       Full                                                             +---------+---------------+---------+-----------+----------+--------------+  PERO      Full                                                             +---------+---------------+---------+-----------+----------+--------------+   +---------+---------------+---------+-----------+----------+--------------+  LEFT      Compressibility Phasicity Spontaneity Properties Thrombus Aging  +---------+---------------+---------+-----------+----------+--------------+  CFV       Full            Yes       Yes                                    +---------+---------------+---------+-----------+----------+--------------+  SFJ       Full                                                             +---------+---------------+---------+-----------+----------+--------------+  FV Prox   Full                                                             +---------+---------------+---------+-----------+----------+--------------+  FV Mid    Full                                                             +---------+---------------+---------+-----------+----------+--------------+  FV Distal Full                                                             +---------+---------------+---------+-----------+----------+--------------+  PFV       Full                                                              +---------+---------------+---------+-----------+----------+--------------+  POP       Full            Yes       Yes                                    +---------+---------------+---------+-----------+----------+--------------+  PTV       Full                                                             +---------+---------------+---------+-----------+----------+--------------+  PERO      Full                                                             +---------+---------------+---------+-----------+----------+--------------+     Summary: Right: There is no evidence of deep vein thrombosis in the lower extremity. No cystic structure found in the popliteal fossa. Left: There is no evidence of deep vein thrombosis in the lower extremity. No cystic structure found in the popliteal fossa.  *See table(s) above for measurements and observations. Electronically signed  by Curt Jews MD on 04/27/2019 at 9:23:02 AM.    Final

## 2019-04-30 NOTE — Progress Notes (Signed)
Verdis Frederickson and husband are coming at 89 for end of life, family visit.

## 2019-04-30 DEATH — deceased

## 2021-06-01 IMAGING — DX DG ABDOMEN 1V
2 series · 2 of 2 positions shown · non-contrast
Comparison: None.

CLINICAL DATA: Ileus.

EXAM:
ABDOMEN - 1 VIEW

[abdomen kub (1 of 2)]
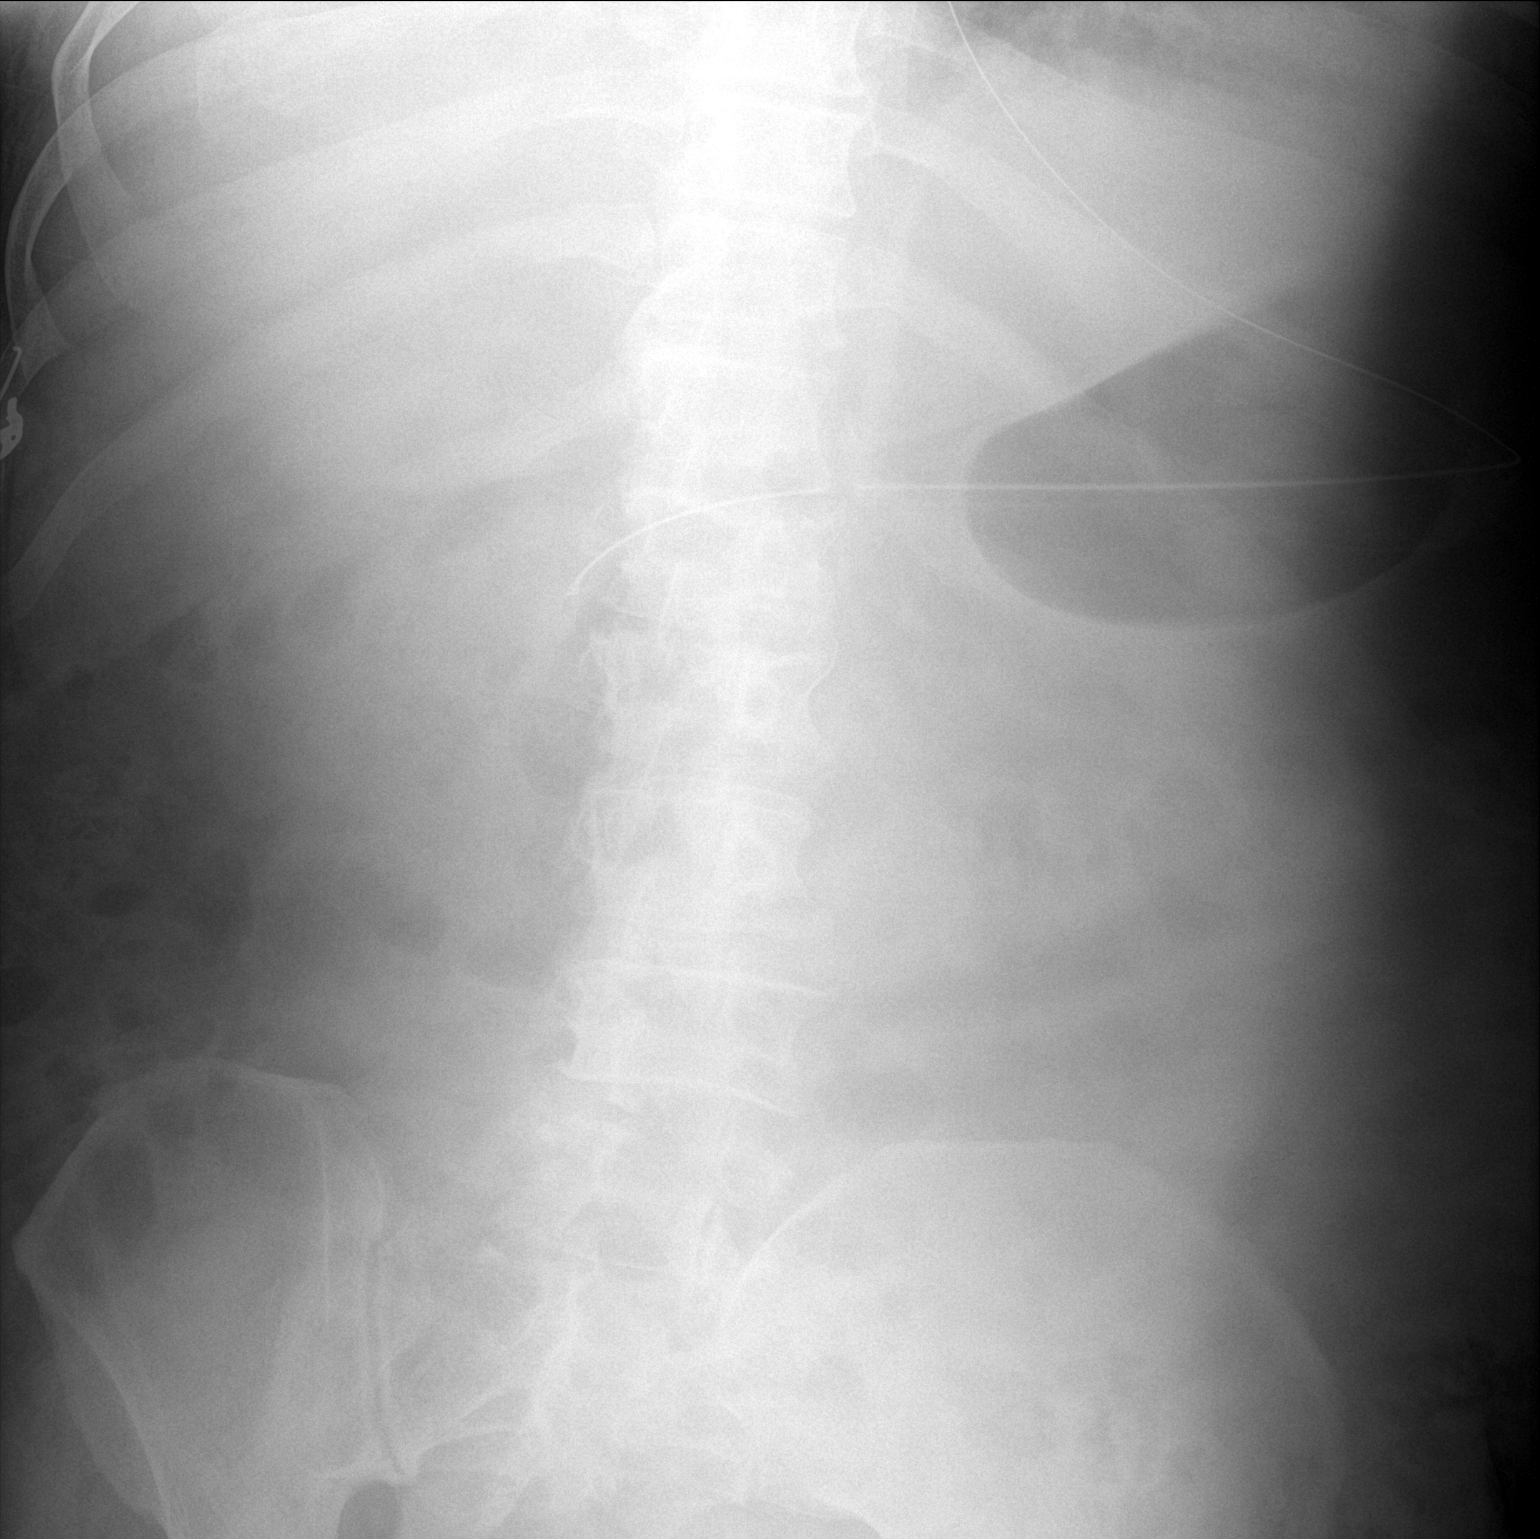

[abdomen kub (2 of 2)]
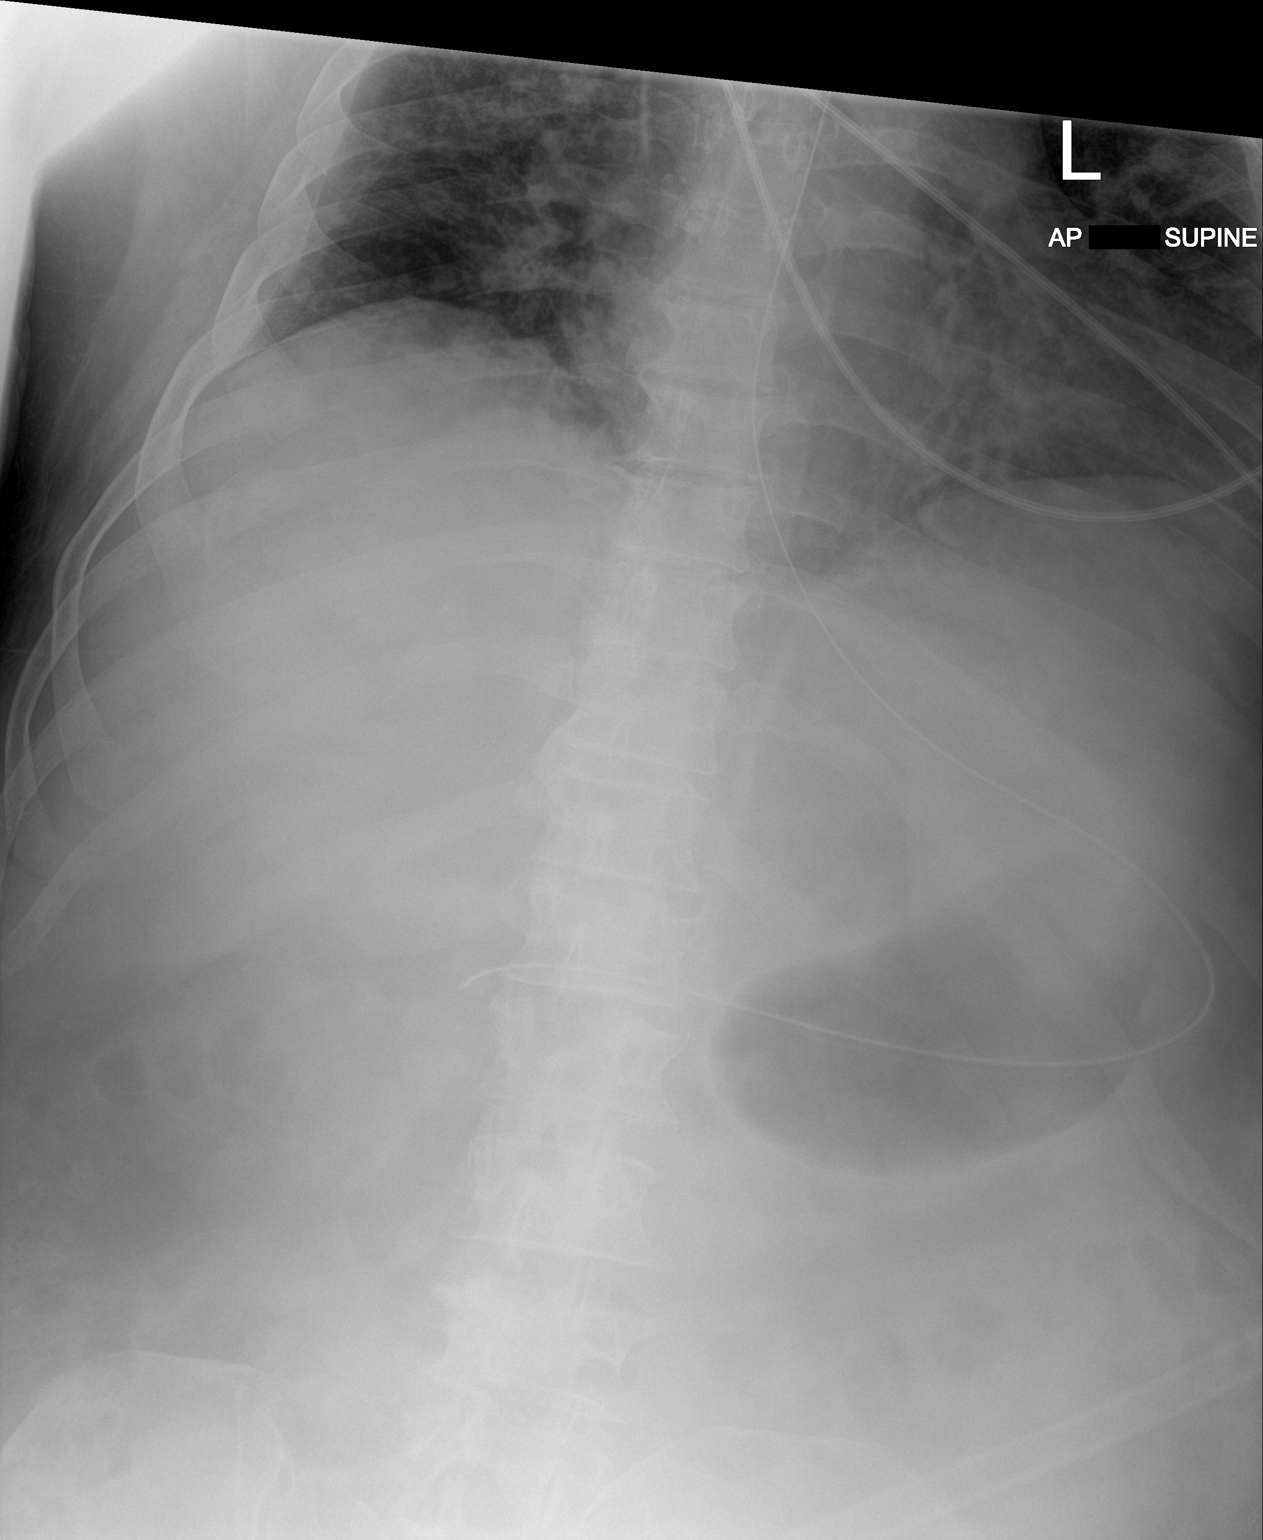

[2 of 2 positions shown; findings below may reference images not displayed]

FINDINGS: Nasogastric tube extends through the stomach and into the region of
the duodenal bulb/proximal descending duodenum. Bowel gas pattern
demonstrates no significant dilated bowel loops or obstructive
pattern. No gross signs of free air. No abnormal calcifications.
IMPRESSION: Nasogastric tube tip lies in the region of the duodenal
bulb/proximal descending duodenum. Normal bowel gas pattern.

## 2021-06-02 IMAGING — US US SCROTUM W/ DOPPLER COMPLETE
1 series · 13 of 25 positions shown · non-contrast
Comparison: Same-day CT abdomen pelvis, scrotal ultrasound,
04/23/2019

CLINICAL DATA: Scrotal edema

EXAM:
SCROTAL ULTRASOUND
DOPPLER ULTRASOUND OF THE TESTICLES
TECHNIQUE: Complete ultrasound examination of the testicles, epididymis, and
other scrotal structures was performed. Color and spectral Doppler
ultrasound were also utilized to evaluate blood flow to the
testicles.

[Series 1: us scrotum w/ doppler complete · 0.08mm/px · 137 acquisitions, 13 frames shown]
[im 1/137]
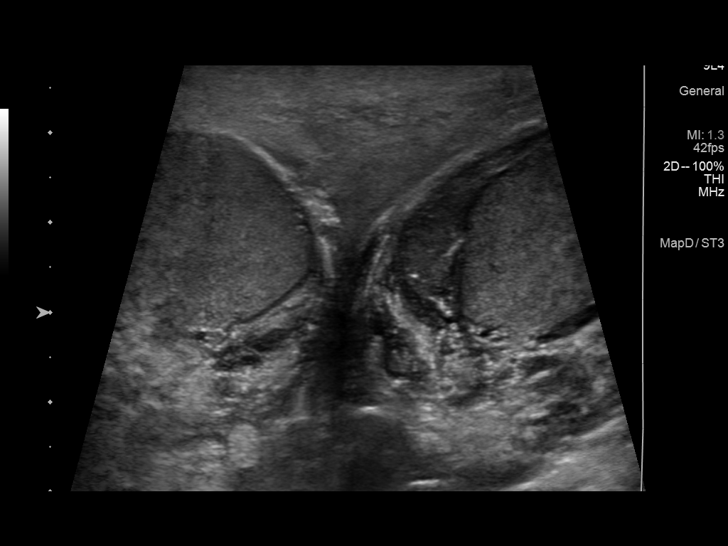
[im 12/137]
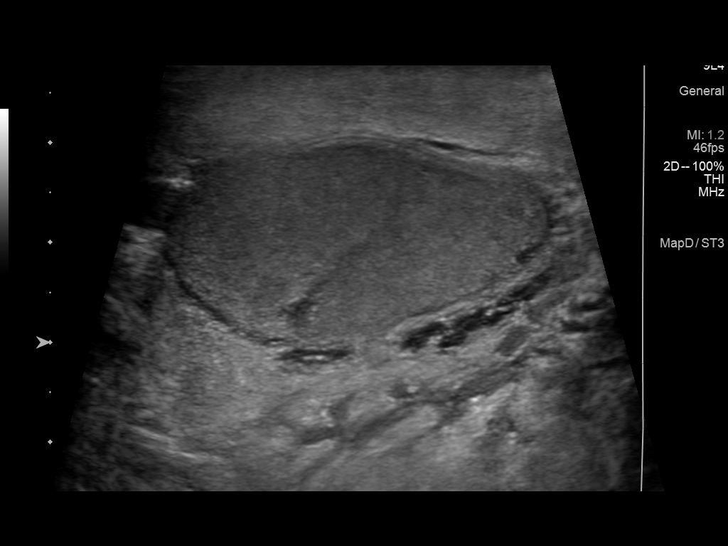
[im 23/137]
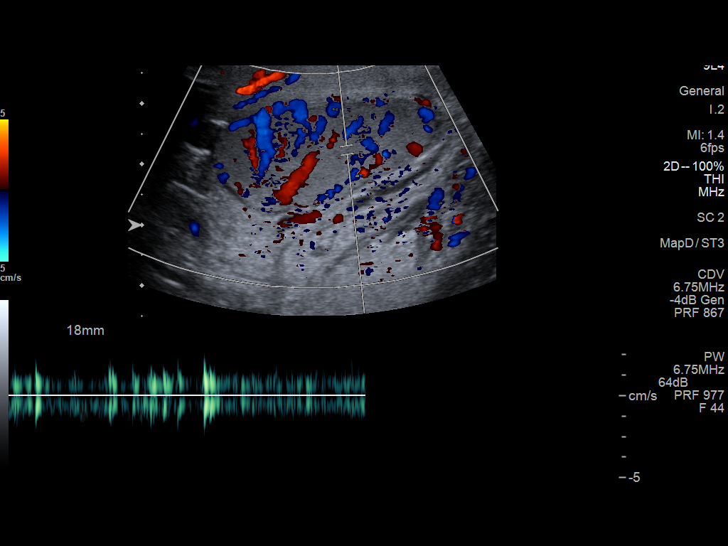
[im 35/137]
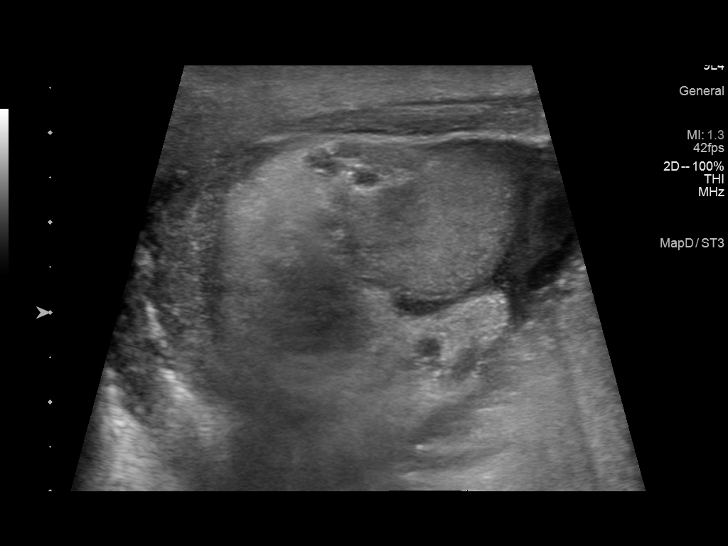
[im 46/137]
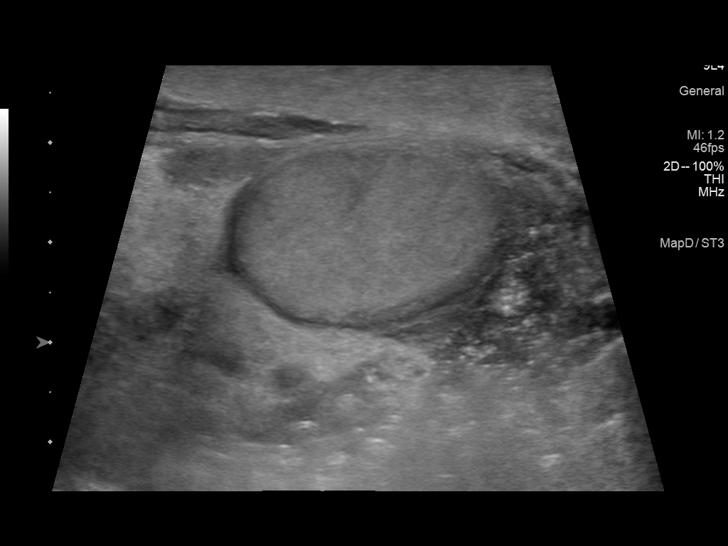
[im 57/137]
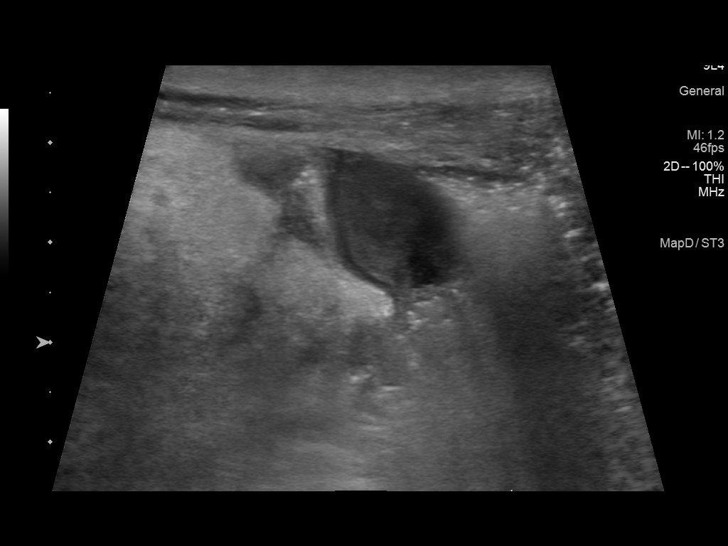
[im 69/137]
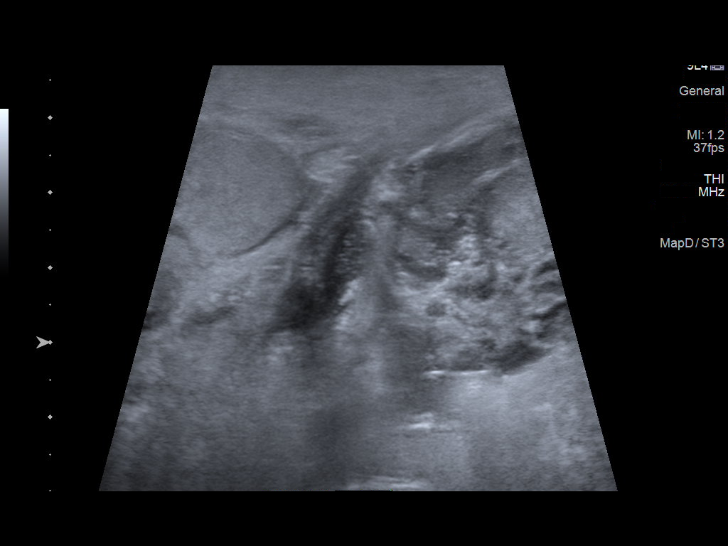
[im 80/137]
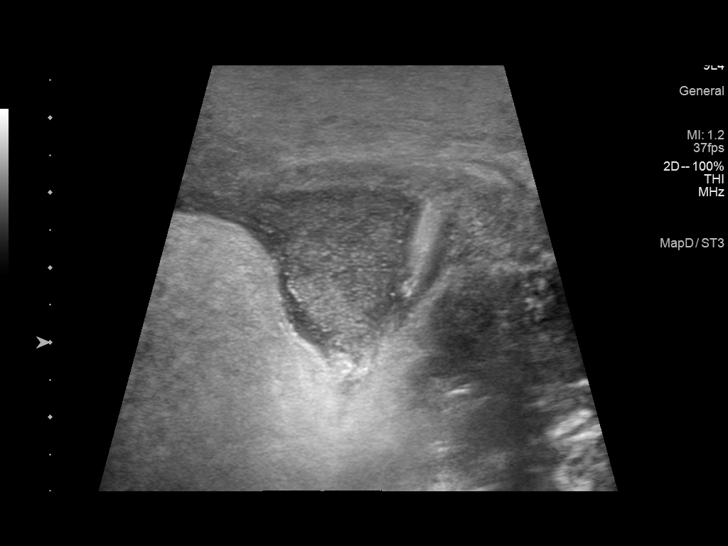
[im 91/137]
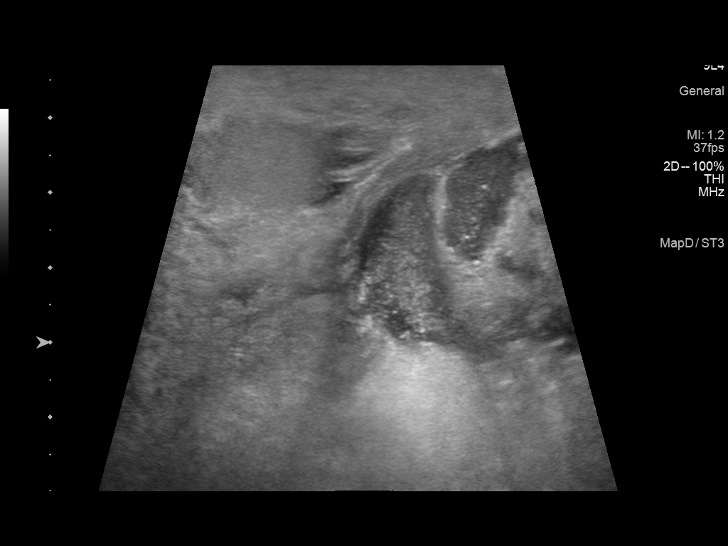
[im 103/137]
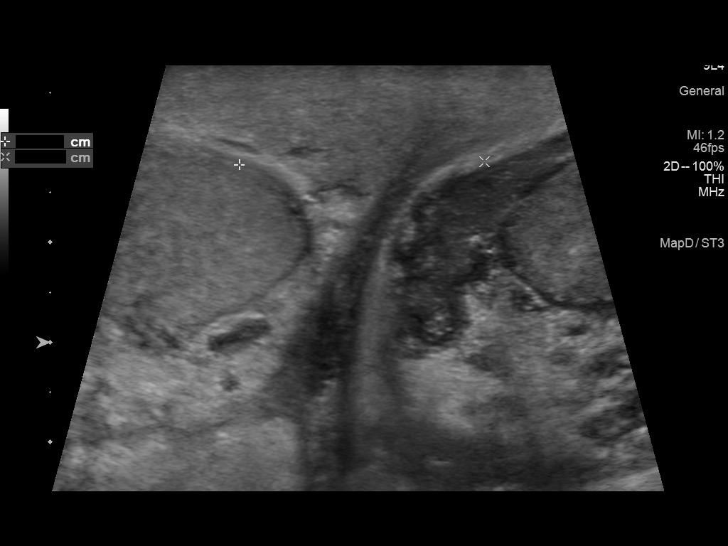
[im 114/137]
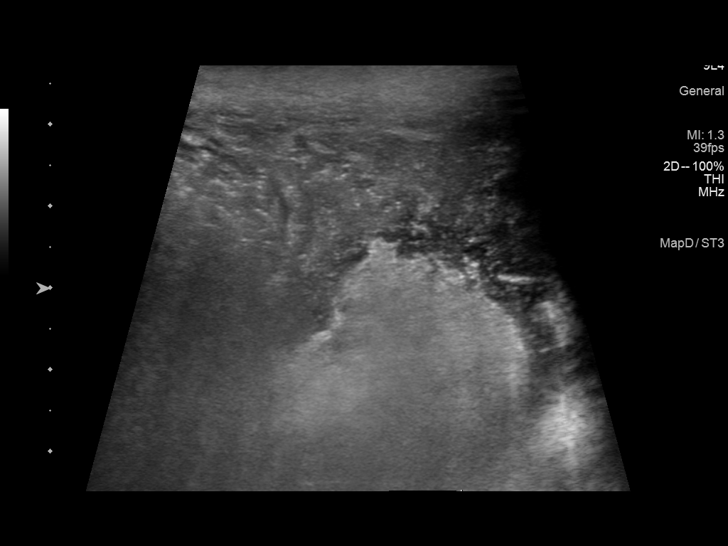
[im 125/137]
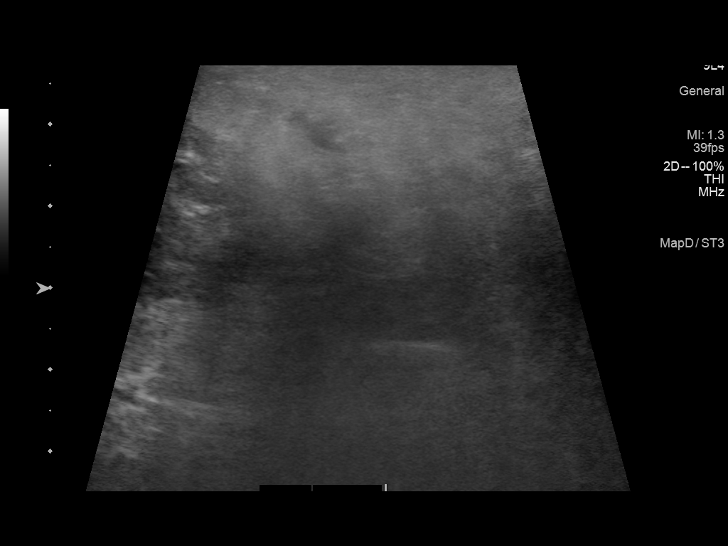
[im 137/137]
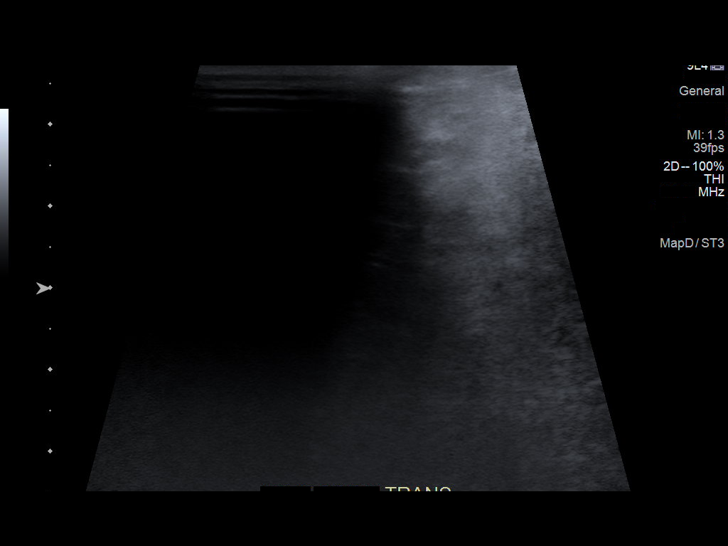

[13 of 25 positions shown; findings below may reference images not displayed]

FINDINGS: Right testicle

Measurements: 3.8 x 2.1 x 2.8 cm. No mass or microlithiasis
visualized.

Left testicle

Measurements: 3.4 x 2.0 x 2.7 cm. No mass or microlithiasis
visualized.

Right epididymis:  Normal in size and appearance.

Left epididymis:  Mildly enlarged.

Hydrocele:  None visualized.

Varicocele:  None visualized.

Other: Air and fluid collection about the lateral aspect of the left
testicle, predominantly obscured by shadowing air foci. Shadowing
air foci similarly within left groin. Extensive thickening of the
scrotal wall.

Pulsed Doppler interrogation of both testes demonstrates hyperemic,
low resistance arterial and venous waveforms bilaterally.
IMPRESSION: 1. Air and fluid collection about the lateral aspect of the left
testicle, predominantly obscured by shadowing air foci. Shadowing
air foci similarly within the left groin. Findings are generally in
keeping with extensive tissue emphysema seen by same-day CT and
similar to prior ultrasound examination dated 04/23/2019, and remain
consistent with gas-forming infection and Ferienhaus gangrene.

2. Mild, nonspecific enlargement of the left epididymis. The
testicles proper are normal in appearance with symmetric bilateral
hyperemic Doppler flow.

3.  Extensive thickening of the scrotal wall.
